# Patient Record
Sex: Female | Born: 1970 | Race: Black or African American | Hispanic: No | Marital: Single | Smoking: Current some day smoker
Health system: Southern US, Community
[De-identification: ages and names within clinical notes are randomized; demographics above are authoritative.]

## PROBLEM LIST (undated history)

## (undated) DIAGNOSIS — R51 Headache: Secondary | ICD-10-CM

## (undated) DIAGNOSIS — I499 Cardiac arrhythmia, unspecified: Secondary | ICD-10-CM

## (undated) DIAGNOSIS — R200 Anesthesia of skin: Secondary | ICD-10-CM

## (undated) DIAGNOSIS — F32A Depression, unspecified: Secondary | ICD-10-CM

## (undated) DIAGNOSIS — F329 Major depressive disorder, single episode, unspecified: Secondary | ICD-10-CM

## (undated) DIAGNOSIS — IMO0001 Reserved for inherently not codable concepts without codable children: Secondary | ICD-10-CM

## (undated) DIAGNOSIS — I209 Angina pectoris, unspecified: Secondary | ICD-10-CM

## (undated) DIAGNOSIS — R519 Headache, unspecified: Secondary | ICD-10-CM

## (undated) DIAGNOSIS — E119 Type 2 diabetes mellitus without complications: Secondary | ICD-10-CM

## (undated) HISTORY — PX: BACK SURGERY: SHX140

## (undated) HISTORY — PX: APPENDECTOMY: SHX54

## (undated) HISTORY — PX: ABLATION: SHX5711

## (undated) HISTORY — PX: BREAST REDUCTION SURGERY: SHX8

## (undated) HISTORY — PX: WISDOM TOOTH EXTRACTION: SHX21

## (undated) HISTORY — PX: COLONOSCOPY: SHX174

## (undated) HISTORY — PX: OTHER SURGICAL HISTORY: SHX169

## (undated) SURGERY — Surgical Case
Anesthesia: *Unknown

---

## 1999-12-07 ENCOUNTER — Inpatient Hospital Stay (HOSPITAL_COMMUNITY)
Admission: RE | Admit: 1999-12-07 | Discharge: 1999-12-10 | Payer: Self-pay | Admitting: Physical Medicine and Rehabilitation

## 2002-06-18 ENCOUNTER — Other Ambulatory Visit: Admission: RE | Admit: 2002-06-18 | Discharge: 2002-06-18 | Payer: Self-pay | Admitting: Obstetrics and Gynecology

## 2004-08-26 ENCOUNTER — Other Ambulatory Visit: Admission: RE | Admit: 2004-08-26 | Discharge: 2004-08-26 | Payer: Self-pay | Admitting: Obstetrics and Gynecology

## 2008-04-09 ENCOUNTER — Emergency Department (HOSPITAL_BASED_OUTPATIENT_CLINIC_OR_DEPARTMENT_OTHER): Admission: EM | Admit: 2008-04-09 | Discharge: 2008-04-09 | Payer: Self-pay | Admitting: Emergency Medicine

## 2008-09-16 ENCOUNTER — Emergency Department (HOSPITAL_BASED_OUTPATIENT_CLINIC_OR_DEPARTMENT_OTHER): Admission: EM | Admit: 2008-09-16 | Discharge: 2008-09-16 | Payer: Self-pay | Admitting: Emergency Medicine

## 2008-10-09 ENCOUNTER — Emergency Department (HOSPITAL_BASED_OUTPATIENT_CLINIC_OR_DEPARTMENT_OTHER): Admission: EM | Admit: 2008-10-09 | Discharge: 2008-10-09 | Payer: Self-pay | Admitting: Emergency Medicine

## 2009-09-13 ENCOUNTER — Emergency Department (HOSPITAL_BASED_OUTPATIENT_CLINIC_OR_DEPARTMENT_OTHER): Admission: EM | Admit: 2009-09-13 | Discharge: 2009-09-13 | Payer: Self-pay | Admitting: Emergency Medicine

## 2009-11-06 ENCOUNTER — Emergency Department (HOSPITAL_BASED_OUTPATIENT_CLINIC_OR_DEPARTMENT_OTHER): Admission: EM | Admit: 2009-11-06 | Discharge: 2009-11-06 | Payer: Self-pay | Admitting: Emergency Medicine

## 2010-07-30 NOTE — Discharge Summary (Signed)
[place]  Patient:    Gina Nichols, Gina Nichols                     MRN: [id number] Adm. Date:  [id number] Disc. Date: 12/10/99 Attending:  Evern Core Dictator:   Bynum Bellows. Idacavage, P.A.C.                           Discharge Summary  DIAGNOSES: 1. Upendynoma. 2. Status post craniotomy. 3. Candida. 4. Anemia.  HISTORY OF PRESENT ILLNESS:  The patient is a 40 year old female who presented to [facility] on November 24, 1999, with a two week history of headache.  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  She was placed on Decadron and Dilantin for seizure prophylaxis.  She underwent bifrontal craniotomy and transcortical resection of interventricular tumor on November 26, 1999, by Dr. ______. Pathology showed upendynoma.  The patient had anemia requiring transfusion. She was placed on Ancef for prophylaxis of her ventriculostomy site.  She developed a right paresis which seemed to be improving.  Was placed on Monostat for vaginal yeast infection.  Blood pressure initially controlled on Vasotec, now stable on no medications.  Able to ambulate with a quad cane.  PAST MEDICAL HISTORY: 1. Status post appendectomy. 2. Status post breast reduction.  SOCIAL HISTORY:  Occasional tobacco, alcohol, and marijuana usage.  Will be discharged home to mothers house.  HOSPITAL COURSE:  The patient was admitted to [facility] on December 07, 1999, where she participated in physical, occupational, and speech therapies.  She participated in a community outing where she went to [employer].  She was unable to estimate the cost of her meal in her head, but was able to add numbers with a pen and paper.  As far as mobility she is noted to be independent with bed mobility, transfers, and ambulation.  Able to ambulate greater than 1000 feet on uneven surfaces with no assistive  device. Challenged physically only by high level balance activities.  Able to stand, kick a ball, play balloon tennis, able to state month when given multiple choice, knew the year.  As far as ______ reliability for biographical information she was 90%, and for factual information she was 25%.  She was able to follow one and two step commands, and understand basic conversation. As far as high level comprehension she was unable to follow multi-step commands.  Comprehensive approximately 50%.  Her swallowing was noted to be regular with thin liquids.  At this point in time it was felt that she was able to be discharged to home with supervision due to impaired cognition.  DISCHARGE MEDICATIONS: 1. Dilantin 100 mg two b.i.d. 2. Monostat for two more days. 3. Multivitamin daily.  ACTIVITY:  She requires supervision.  DIET:  She was asked to follow a balanced diet.  WOUND CARE:  Keep her wound clean and dry.  FOLLOWUP:  Dr. ______ in two weeks.  On the day of discharge her Dilantin level was noted to be 8.6, and her H&H was 11.1 and 31.1.  CONDITION ON DISCHARGE:  Stable. DD:  12/10/99 TD:  12/10/99 Job: [id number]

## 2010-10-01 ENCOUNTER — Encounter: Payer: Self-pay | Admitting: *Deleted

## 2010-10-01 ENCOUNTER — Emergency Department (HOSPITAL_BASED_OUTPATIENT_CLINIC_OR_DEPARTMENT_OTHER)
Admission: EM | Admit: 2010-10-01 | Discharge: 2010-10-02 | Disposition: A | Discharge status: Home / Self Care | Payer: Medicare Other | Attending: Emergency Medicine | Admitting: Emergency Medicine

## 2010-10-01 DIAGNOSIS — L255 Unspecified contact dermatitis due to plants, except food: Secondary | ICD-10-CM | POA: Insufficient documentation

## 2010-10-01 DIAGNOSIS — T622X1A Toxic effect of other ingested (parts of) plant(s), accidental (unintentional), initial encounter: Secondary | ICD-10-CM | POA: Insufficient documentation

## 2010-10-01 DIAGNOSIS — L259 Unspecified contact dermatitis, unspecified cause: Secondary | ICD-10-CM

## 2010-10-01 MED ORDER — DIPHENHYDRAMINE HCL 50 MG/ML IJ SOLN
50.0000 mg | Freq: Once | INTRAMUSCULAR | Status: AC
  Administered 2010-10-02: 50 mg via INTRAMUSCULAR
  Filled 2010-10-01: qty 1

## 2010-10-01 MED ORDER — DEXAMETHASONE SODIUM PHOSPHATE 10 MG/ML IJ SOLN
10.0000 mg | Freq: Once | INTRAMUSCULAR | Status: AC
  Administered 2010-10-02: 10 mg via INTRAMUSCULAR
  Filled 2010-10-01: qty 1

## 2010-10-01 NOTE — ED Notes (Signed)
Pt presents to ED today with poison ivy exposure.  Pt states "its spreading"  Pt tried calamine lotion with no relief of sx.

## 2010-10-02 MED ORDER — DIPHENHYDRAMINE HCL 25 MG PO TABS: 25.0000 mg | ORAL_TABLET | Freq: Four times a day (QID) | ORAL | Status: AC

## 2010-10-02 MED ORDER — PREDNISONE 10 MG PO TABS: 20.0000 mg | ORAL_TABLET | Freq: Every day | ORAL | Status: AC

## 2010-10-02 NOTE — ED Provider Notes (Signed)
History     Chief Complaint  Patient presents with  . Poison Ivy   Patient is a 40 y.o. female presenting with Poison Ivy. The history is provided by the patient.  Poison Lajoyce Corners This is a new problem. The current episode started 2 days ago. The problem occurs constantly. The problem has been gradually worsening. Pertinent negatives include no shortness of breath. The symptoms are aggravated by nothing. She has tried nothing for the symptoms.  h/o a/w posion ivy exposure, no fever  History reviewed. No pertinent past medical history.  History reviewed. No pertinent past surgical history.  No family history on file.  History  Substance Use Topics  . Smoking status: Never Smoker   . Smokeless tobacco: Not on file  . Alcohol Use: No    OB History    Grav Para Term Preterm Abortions TAB SAB Ect Mult Living                  Review of Systems  Respiratory: Negative for shortness of breath.   All other systems reviewed and are negative.    Physical Exam  BP 148/88  Pulse 75  Temp(Src) 98 F (36.7 C) (Oral)  Resp 18  SpO2 99%  Physical Exam  Nursing note and vitals reviewed. Constitutional: She is oriented to person, place, and time. She appears well-developed and well-nourished.  Non-toxic appearance.  HENT:  Head: Normocephalic and atraumatic.  Eyes: Conjunctivae are normal. Pupils are equal, round, and reactive to light.  Neck: Normal range of motion.  Cardiovascular: Normal rate.   Pulmonary/Chest: Effort normal.  Neurological: She is alert and oriented to person, place, and time.  Skin: Skin is warm and dry.  Psychiatric: She has a normal mood and affect.  skin: vesicles with erythema noted to left forearm and back c/w dermatitis  ED Course  Procedures  MDM   Decadron and benadryl given here      Toy Baker, MD 10/02/10 [phone]

## 2012-08-18 DIAGNOSIS — D496 Neoplasm of unspecified behavior of brain: Secondary | ICD-10-CM | POA: Insufficient documentation

## 2013-05-04 ENCOUNTER — Emergency Department (HOSPITAL_BASED_OUTPATIENT_CLINIC_OR_DEPARTMENT_OTHER): Payer: Medicare Other

## 2013-05-04 ENCOUNTER — Emergency Department (HOSPITAL_BASED_OUTPATIENT_CLINIC_OR_DEPARTMENT_OTHER)
Admission: EM | Admit: 2013-05-04 | Discharge: 2013-05-05 | Disposition: A | Discharge status: Other Acute Inpatient Hospital | Payer: Medicare Other | Attending: Emergency Medicine | Admitting: Emergency Medicine

## 2013-05-04 ENCOUNTER — Encounter (HOSPITAL_BASED_OUTPATIENT_CLINIC_OR_DEPARTMENT_OTHER): Payer: Self-pay | Admitting: Emergency Medicine

## 2013-05-04 DIAGNOSIS — R5383 Other fatigue: Secondary | ICD-10-CM

## 2013-05-04 DIAGNOSIS — K5289 Other specified noninfective gastroenteritis and colitis: Secondary | ICD-10-CM | POA: Insufficient documentation

## 2013-05-04 DIAGNOSIS — Z79899 Other long term (current) drug therapy: Secondary | ICD-10-CM | POA: Insufficient documentation

## 2013-05-04 DIAGNOSIS — R5381 Other malaise: Secondary | ICD-10-CM | POA: Insufficient documentation

## 2013-05-04 DIAGNOSIS — K529 Noninfective gastroenteritis and colitis, unspecified: Secondary | ICD-10-CM

## 2013-05-04 DIAGNOSIS — I498 Other specified cardiac arrhythmias: Secondary | ICD-10-CM | POA: Insufficient documentation

## 2013-05-04 DIAGNOSIS — I214 Non-ST elevation (NSTEMI) myocardial infarction: Secondary | ICD-10-CM | POA: Insufficient documentation

## 2013-05-04 DIAGNOSIS — I471 Supraventricular tachycardia: Secondary | ICD-10-CM

## 2013-05-04 LAB — BASIC METABOLIC PANEL
BUN: 13 mg/dL (ref 6–23)
CHLORIDE: 101 meq/L (ref 96–112)
CO2: 22 mEq/L (ref 19–32)
Calcium: 8.6 mg/dL (ref 8.4–10.5)
Creatinine, Ser: 1 mg/dL (ref 0.50–1.10)
GFR calc non Af Amer: 68 mL/min — ABNORMAL LOW (ref >90)
GFR, EST AFRICAN AMERICAN: 79 mL/min — AB (ref >90)
Glucose, Bld: 113 mg/dL — ABNORMAL HIGH (ref 70–99)
POTASSIUM: 3.7 meq/L (ref 3.7–5.3)
Sodium: 139 mEq/L (ref 137–147)

## 2013-05-04 LAB — CBC WITH DIFFERENTIAL/PLATELET
BASOS PCT: 0 % (ref 0–1)
Basophils Absolute: 0 10*3/uL (ref 0.0–0.1)
Eosinophils Absolute: 0 10*3/uL (ref 0.0–0.7)
Eosinophils Relative: 0 % (ref 0–5)
HCT: 42.8 % (ref 36.0–46.0)
HEMOGLOBIN: 13.9 g/dL (ref 12.0–15.0)
LYMPHS PCT: 15 % (ref 12–46)
Lymphs Abs: 1 10*3/uL (ref 0.7–4.0)
MCH: 29.7 pg (ref 26.0–34.0)
MCHC: 32.5 g/dL (ref 30.0–36.0)
MCV: 91.5 fL (ref 78.0–100.0)
MONOS PCT: 13 % — AB (ref 3–12)
Monocytes Absolute: 0.9 10*3/uL (ref 0.1–1.0)
NEUTROS ABS: 4.6 10*3/uL (ref 1.7–7.7)
NEUTROS PCT: 71 % (ref 43–77)
Platelets: 298 10*3/uL (ref 150–400)
RBC: 4.68 MIL/uL (ref 3.87–5.11)
RDW: 13.4 % (ref 11.5–15.5)
WBC: 6.4 10*3/uL (ref 4.0–10.5)

## 2013-05-04 LAB — TROPONIN I: Troponin I: 0.37 ng/mL (ref <0.30)

## 2013-05-04 MED ORDER — SODIUM CHLORIDE 0.9 % IV BOLUS (SEPSIS)
1000.0000 mL | Freq: Once | INTRAVENOUS | Status: AC
  Administered 2013-05-04: 1000 mL via INTRAVENOUS

## 2013-05-04 MED ORDER — ADENOSINE 6 MG/2ML IV SOLN
INTRAVENOUS | Status: AC
  Administered 2013-05-04: 6 mg
  Filled 2013-05-04: qty 6

## 2013-05-04 MED ORDER — ASPIRIN 81 MG PO CHEW
324.0000 mg | CHEWABLE_TABLET | Freq: Once | ORAL | Status: AC
  Administered 2013-05-05: 324 mg via ORAL
  Filled 2013-05-04: qty 4

## 2013-05-04 NOTE — ED Provider Notes (Addendum)
CSN: [id number]     Arrival date & time 05/04/13  1845 History  This chart was scribed for Gina Johns, MD by Zettie Pho, ED Scribe. This patient was seen in room MHT14/MHT14 and the patient's care was started at 7:09 PM.    Chief Complaint  Patient presents with  . Emesis  . Diarrhea   The history is provided by the patient. No language interpreter was used.   HPI Comments: Gina Nichols is a 43 y.o. female who presents to the Emergency Department complaining of persistent nausea with associated multiple episodes of emesis and watery diarrhea onset about 2 days ago. She denies any emesis today and that she was able to tolerate some chicken noodle soup and applesauce earlier today, but had diarrhea soon afterwards. Patient is also complaining of diffuse weakness. Patient reports that she has been exposed to sick contacts with similar symptoms. She denies fever.  She reports some associated chest pain, palpitations, diaphoresis, and shortness of breath onset about 45 minutes ago, which she states has been gradually improving. This started suddenly whe she was getting in the car to come to the ED.  Patient presents to the ED with a heart rate of 209. Patient reports a history of unspecified cardiomegaly, but denies history of HTN or CAD. She states that she has been on blood pressure medication in the past, but has not taken it "in a while."  History reviewed. No pertinent past medical history. Past Surgical History  Procedure Laterality Date  . Appendectomy    . Brain tumor    . Back surgery     History reviewed. No pertinent family history. History  Substance Use Topics  . Smoking status: Never Smoker   . Smokeless tobacco: Not on file  . Alcohol Use: No   OB History   Grav Para Term Preterm Abortions TAB SAB Ect Mult Living                 Review of Systems  Constitutional: Negative for fever, chills, diaphoresis and fatigue.  HENT: Negative for congestion, rhinorrhea and  sneezing.   Eyes: Negative.   Respiratory: Positive for shortness of breath. Negative for cough and chest tightness.   Cardiovascular: Positive for chest pain and palpitations. Negative for leg swelling.  Gastrointestinal: Positive for nausea, vomiting and diarrhea. Negative for abdominal pain and blood in stool.  Genitourinary: Negative for frequency, hematuria, flank pain and difficulty urinating.  Musculoskeletal: Negative for arthralgias and back pain.  Skin: Negative for rash.  Neurological: Positive for weakness. Negative for dizziness, speech difficulty, numbness and headaches.   Allergies  Review of patient's allergies indicates no known allergies.  Home Medications   Current Outpatient Rx  Name  Route  Sig  Dispense  Refill  . benzonatate (TESSALON) 100 MG capsule   Oral   Take by mouth 3 (three) times daily as needed for cough.         . multivitamin (THERAGRAN) tablet   Oral   Take 1 tablet by mouth daily.           Marland Kitchen oxyCODONE (OXYCONTIN) 10 MG 12 hr tablet   Oral   Take 10 mg by mouth every 12 (twelve) hours as needed.           Marland Kitchen NAPROXEN PO   Oral   Take 1 tablet by mouth daily as needed.           Marland Kitchen oxyCODONE-acetaminophen (PERCOCET) 10-325 MG per tablet  Oral   Take 1 tablet by mouth daily as needed.            Triage Vitals: BP 81/64  Pulse 99  Temp(Src) 99.5 F (37.5 C)  Resp 24  Ht 5\' 5"  (1.651 m)  Wt 226 lb (102.513 kg)  BMI 37.61 kg/m2  SpO2 99%  Physical Exam  Constitutional: She is oriented to person, place, and time. She appears well-developed and well-nourished.  HENT:  Head: Normocephalic and atraumatic.  Eyes: Pupils are equal, round, and reactive to light.  Neck: Normal range of motion. Neck supple.  Cardiovascular: Regular rhythm and normal heart sounds.  Tachycardia present.   Tachycardic, but regular.   Pulmonary/Chest: Effort normal and breath sounds normal. No respiratory distress. She has no wheezes. She has no  rales. She exhibits no tenderness.  Abdominal: Soft. Bowel sounds are normal. There is no tenderness. There is no rebound and no guarding.  Musculoskeletal: Normal range of motion. She exhibits no edema.  Lymphadenopathy:    She has no cervical adenopathy.  Neurological: She is alert and oriented to person, place, and time.  Skin: Skin is warm and dry. No rash noted.  Psychiatric: She has a normal mood and affect.    ED Course  Procedures (including critical care time)  DIAGNOSTIC STUDIES: Oxygen Saturation is 99% on room air, normal by my interpretation.    COORDINATION OF CARE: 7:15 PM- Discussed that patient's heart rhythm is irregular, which may have been brought on by her current GI illness. Ordered adenosine to manage symptoms. Patient's heart rate was brought down to 109 with regular sinus rhythm after receiving the adenosine. Will order IV fluids. Will order blood labs. Discussed treatment plan with patient at bedside and patient verbalized agreement.   Results for orders placed during the hospital encounter of 05/04/13  CBC WITH DIFFERENTIAL      Result Value Ref Range   WBC 6.4  4.0 - 10.5 K/uL   RBC 4.68  3.87 - 5.11 MIL/uL   Hemoglobin 13.9  12.0 - 15.0 g/dL   HCT 42.8  36.0 - 46.0 %   MCV 91.5  78.0 - 100.0 fL   MCH 29.7  26.0 - 34.0 pg   MCHC 32.5  30.0 - 36.0 g/dL   RDW 13.4  11.5 - 15.5 %   Platelets 298  150 - 400 K/uL   Neutrophils Relative % 71  43 - 77 %   Neutro Abs 4.6  1.7 - 7.7 K/uL   Lymphocytes Relative 15  12 - 46 %   Lymphs Abs 1.0  0.7 - 4.0 K/uL   Monocytes Relative 13 (*) 3 - 12 %   Monocytes Absolute 0.9  0.1 - 1.0 K/uL   Eosinophils Relative 0  0 - 5 %   Eosinophils Absolute 0.0  0.0 - 0.7 K/uL   Basophils Relative 0  0 - 1 %   Basophils Absolute 0.0  0.0 - 0.1 K/uL  BASIC METABOLIC PANEL      Result Value Ref Range   Sodium 139  137 - 147 mEq/L   Potassium 3.7  3.7 - 5.3 mEq/L   Chloride 101  96 - 112 mEq/L   CO2 22  19 - 32 mEq/L    Glucose, Bld 113 (*) 70 - 99 mg/dL   BUN 13  6 - 23 mg/dL   Creatinine, Ser 1.00  0.50 - 1.10 mg/dL   Calcium 8.6  8.4 - 10.5 mg/dL   GFR calc  non Af Amer 68 (*) >90 mL/min   GFR calc Af Amer 79 (*) >90 mL/min  TROPONIN I      Result Value Ref Range   Troponin I <0.30  <0.30 ng/mL  TROPONIN I      Result Value Ref Range   Troponin I 0.37 (*) <0.30 ng/mL   Dg Chest Port 1 View  05/04/2013   CLINICAL DATA:  Chest pain.  Shortness of breath.  EXAM: PORTABLE CHEST - 1 VIEW  COMPARISON:  None.  FINDINGS: Borderline cardiomegaly. No CHF. Lungs are clear. No pneumothorax. No acute bony abnormality.  IMPRESSION: Borderline cardiomegaly, no CHF.  No acute pulmonary disease.   Electronically Signed   By: Marcello Moores  Register   On: 05/04/2013 23:44      Date: 05/04/2013  Rate: 209  Rhythm: supraventricular tachycardia (SVT)  QRS Axis: normal  Intervals: normal  ST/T Wave abnormalities: nonspecific ST/T changes  Conduction Disutrbances:none  Narrative Interpretation:   Old EKG Reviewed: none available   Date: 05/04/2013  Rate: 100  Rhythm: normal sinus rhythm  QRS Axis: normal  Intervals: normal  ST/T Wave abnormalities: normal  Conduction Disutrbances:none  Narrative Interpretation:   Old EKG Reviewed: unchanged    MDM   Final diagnoses:  NSTEMI (non-ST elevated myocardial infarction)  SVT (supraventricular tachycardia)  Gastroenteritis    CRITICAL CARE Performed by: Tamie Minteer Total critical care time: 70 Critical care time was exclusive of separately billable procedures and treating other patients. Critical care was necessary to treat or prevent imminent or life-threatening deterioration. Critical care was time spent personally by me on the following activities: development of treatment plan with patient and/or surrogate as well as nursing, discussions with consultants, evaluation of patient's response to treatment, examination of patient, obtaining history from patient or  surrogate, ordering and performing treatments and interventions, ordering and review of laboratory studies, ordering and review of radiographic studies, pulse oximetry and re-evaluation of patient's condition.   Patient presents with gastroenteritis type symptoms with ongoing diarrhea. She appears very dehydrated on arrival. She was in SVT on arrival. This converted with adenosine. Her chest pain resolved after the conversion with adenosine. She was still tachycardic following the adenosine but was in a sinus rhythm. Her tachycardia resolved after 2 L IV fluids. She's currently has a heart rate in the 80s to 90s. She had one brief episode of chest tightness during her ED stay. This lasted a few minutes. I checked a second troponin which was mildly elevated. Given this I feel that she's currently to be admitted for further cardiac evaluation. Patient is requesting to [facility] to [facility]. I spoke with Dr. Marijo File, the cardiologist on-call at that facility and he requests that the hospitalist admit the patient. I will go ahead and give the patient aspirin however Dr. Marijo File did not feel that we need to start heparin at this point.  I spoke with Dr. Juleen China who accepts the pt for transfer.  I personally performed the services described in this documentation, which was scribed in my presence.  The recorded information has been reviewed and considered.     Gina Johns, MD 05/04/13 [id number]  Gina Johns, MD 05/04/13 2348

## 2013-05-04 NOTE — ED Notes (Signed)
Patient started haexperiencing  n/v/d two days ago, able to eat chicken soup today, but had diarrhea soon afterwards., no fever that she is aware, vomited last night and some diarrhea today after eating soup, no meds taken

## 2013-05-05 MED ORDER — ONDANSETRON HCL 4 MG/2ML IJ SOLN
INTRAMUSCULAR | Status: AC
  Administered 2013-05-05: 4 mg
  Filled 2013-05-05: qty 2

## 2013-08-25 ENCOUNTER — Encounter (HOSPITAL_BASED_OUTPATIENT_CLINIC_OR_DEPARTMENT_OTHER): Payer: Self-pay | Admitting: Emergency Medicine

## 2013-08-25 DIAGNOSIS — Z79899 Other long term (current) drug therapy: Secondary | ICD-10-CM | POA: Insufficient documentation

## 2013-08-25 DIAGNOSIS — M7989 Other specified soft tissue disorders: Secondary | ICD-10-CM | POA: Insufficient documentation

## 2013-08-25 DIAGNOSIS — Z791 Long term (current) use of non-steroidal anti-inflammatories (NSAID): Secondary | ICD-10-CM | POA: Insufficient documentation

## 2013-08-25 DIAGNOSIS — M5380 Other specified dorsopathies, site unspecified: Secondary | ICD-10-CM | POA: Insufficient documentation

## 2013-08-25 DIAGNOSIS — Z3202 Encounter for pregnancy test, result negative: Secondary | ICD-10-CM | POA: Insufficient documentation

## 2013-08-25 DIAGNOSIS — E119 Type 2 diabetes mellitus without complications: Secondary | ICD-10-CM | POA: Insufficient documentation

## 2013-08-25 NOTE — ED Notes (Signed)
Pt reports back pain spasm's and leg edema since Friday past denies event or injury

## 2013-08-26 ENCOUNTER — Emergency Department (HOSPITAL_BASED_OUTPATIENT_CLINIC_OR_DEPARTMENT_OTHER)
Admission: EM | Admit: 2013-08-26 | Discharge: 2013-08-26 | Disposition: A | Discharge status: Home / Self Care | Payer: Medicare Other | Attending: Emergency Medicine | Admitting: Emergency Medicine

## 2013-08-26 ENCOUNTER — Encounter (HOSPITAL_BASED_OUTPATIENT_CLINIC_OR_DEPARTMENT_OTHER): Payer: Self-pay | Admitting: Emergency Medicine

## 2013-08-26 ENCOUNTER — Emergency Department (HOSPITAL_BASED_OUTPATIENT_CLINIC_OR_DEPARTMENT_OTHER): Payer: Medicare Other

## 2013-08-26 DIAGNOSIS — M7989 Other specified soft tissue disorders: Secondary | ICD-10-CM

## 2013-08-26 DIAGNOSIS — M62830 Muscle spasm of back: Secondary | ICD-10-CM

## 2013-08-26 HISTORY — DX: Type 2 diabetes mellitus without complications: E11.9

## 2013-08-26 LAB — URINALYSIS, ROUTINE W REFLEX MICROSCOPIC
Bilirubin Urine: NEGATIVE
Glucose, UA: NEGATIVE mg/dL
Hgb urine dipstick: NEGATIVE
KETONES UR: NEGATIVE mg/dL
LEUKOCYTES UA: NEGATIVE
NITRITE: NEGATIVE
PH: 6 (ref 5.0–8.0)
PROTEIN: NEGATIVE mg/dL
Specific Gravity, Urine: 1.029 (ref 1.005–1.030)
Urobilinogen, UA: 1 mg/dL (ref 0.0–1.0)

## 2013-08-26 LAB — BASIC METABOLIC PANEL
BUN: 12 mg/dL (ref 6–23)
CHLORIDE: 106 meq/L (ref 96–112)
CO2: 26 mEq/L (ref 19–32)
Calcium: 8.9 mg/dL (ref 8.4–10.5)
Creatinine, Ser: 0.8 mg/dL (ref 0.50–1.10)
GFR calc non Af Amer: 88 mL/min — ABNORMAL LOW (ref >90)
Glucose, Bld: 105 mg/dL — ABNORMAL HIGH (ref 70–99)
POTASSIUM: 3.9 meq/L (ref 3.7–5.3)
Sodium: 143 mEq/L (ref 137–147)

## 2013-08-26 LAB — CBC WITH DIFFERENTIAL/PLATELET
BASOS ABS: 0 10*3/uL (ref 0.0–0.1)
BASOS PCT: 0 % (ref 0–1)
Eosinophils Absolute: 0.1 10*3/uL (ref 0.0–0.7)
Eosinophils Relative: 1 % (ref 0–5)
HCT: 33.2 % — ABNORMAL LOW (ref 36.0–46.0)
Hemoglobin: 11 g/dL — ABNORMAL LOW (ref 12.0–15.0)
Lymphocytes Relative: 29 % (ref 12–46)
Lymphs Abs: 2.3 10*3/uL (ref 0.7–4.0)
MCH: 31.5 pg (ref 26.0–34.0)
MCHC: 33.1 g/dL (ref 30.0–36.0)
MCV: 95.1 fL (ref 78.0–100.0)
Monocytes Absolute: 0.7 10*3/uL (ref 0.1–1.0)
Monocytes Relative: 9 % (ref 3–12)
NEUTROS ABS: 4.7 10*3/uL (ref 1.7–7.7)
NEUTROS PCT: 61 % (ref 43–77)
PLATELETS: 252 10*3/uL (ref 150–400)
RBC: 3.49 MIL/uL — ABNORMAL LOW (ref 3.87–5.11)
RDW: 12.8 % (ref 11.5–15.5)
WBC: 7.7 10*3/uL (ref 4.0–10.5)

## 2013-08-26 LAB — D-DIMER, QUANTITATIVE (NOT AT ARMC)

## 2013-08-26 LAB — PREGNANCY, URINE: PREG TEST UR: NEGATIVE

## 2013-08-26 MED ORDER — METHOCARBAMOL 500 MG PO TABS
1000.0000 mg | ORAL_TABLET | Freq: Once | ORAL | Status: AC
  Administered 2013-08-26: 1000 mg via ORAL
  Filled 2013-08-26: qty 2

## 2013-08-26 MED ORDER — METHOCARBAMOL 500 MG PO TABS: 500.0000 mg | ORAL_TABLET | Freq: Two times a day (BID) | ORAL | Status: DC

## 2013-08-26 MED ORDER — KETOROLAC TROMETHAMINE 60 MG/2ML IM SOLN
60.0000 mg | Freq: Once | INTRAMUSCULAR | Status: AC
  Administered 2013-08-26: 60 mg via INTRAMUSCULAR
  Filled 2013-08-26: qty 2

## 2013-08-26 MED ORDER — NAPROXEN 375 MG PO TABS: 375.0000 mg | ORAL_TABLET | Freq: Two times a day (BID) | ORAL | Status: DC

## 2013-08-26 MED ORDER — TRAMADOL HCL 50 MG PO TABS
50.0000 mg | ORAL_TABLET | Freq: Once | ORAL | Status: AC
  Administered 2013-08-26: 50 mg via ORAL
  Filled 2013-08-26: qty 1

## 2013-08-26 MED ORDER — TRAMADOL HCL 50 MG PO TABS: 50.0000 mg | ORAL_TABLET | Freq: Four times a day (QID) | ORAL | Status: DC | PRN

## 2013-08-26 NOTE — ED Provider Notes (Signed)
CSN: [id number]     Arrival date & time 08/25/13  2252 History  This chart was scribed for Gina Tomey Alfonso Patten, MD by Roxan Diesel, ED scribe.  This patient was seen in room MH12/MH12 and the patient's care was started at 12:37 AM. .    Chief Complaint  Patient presents with  . Back Pain  . Leg Swelling    Patient is a 43 y.o. female presenting with back pain. The history is provided by the patient. No language interpreter was used.  Back Pain Location:  Lumbar spine Quality: spasming. Radiates to:  R posterior upper leg Pain severity:  Moderate Context comment:  Back surgery in 2012  HPI Comments: Lonzo Cloud is a 43 y.o. female who presents to the Emergency Department complaining of leg swelling beginning yesterday. Patient states that the swelling is more severe in her left leg. She has elevated and ice her leg without relief of the swelling. She denies any recent long car or airplane trips. She reports having a cardiac ablastion two months ago and that she had similar leg swelling the day after the procedure, but it resolved the next day.   Patient also reports back spasms.  She states that the pain radiates to her upper right buttock. She has a history of back surgery in 2012.   Dr. Vista Lawman is her PCP.    Past Medical History  Diagnosis Date  . Diabetes mellitus without complication    Past Surgical History  Procedure Laterality Date  . Appendectomy    . Brain tumor    . Back surgery    . Ablation    . Breast reduction surgery     History reviewed. No pertinent family history. History  Substance Use Topics  . Smoking status: Never Smoker   . Smokeless tobacco: Never Used  . Alcohol Use: No   OB History   Grav Para Term Preterm Abortions TAB SAB Ect Mult Living                   Review of Systems  Cardiovascular: Positive for leg swelling.  Musculoskeletal: Positive for back pain.  All other systems reviewed and are  negative.     Allergies  Review of patient's allergies indicates no known allergies.  Home Medications   Prior to Admission medications   Medication Sig Start Date End Date Taking? Authorizing Provider  benzonatate (TESSALON) 100 MG capsule Take by mouth 3 (three) times daily as needed for cough.    Historical Provider, MD  multivitamin [facility]) tablet Take 1 tablet by mouth daily.      Historical Provider, MD  NAPROXEN PO Take 1 tablet by mouth daily as needed.      Historical Provider, MD  oxyCODONE (OXYCONTIN) 10 MG 12 hr tablet Take 10 mg by mouth every 12 (twelve) hours as needed.      Historical Provider, MD  oxyCODONE-acetaminophen (PERCOCET) 10-325 MG per tablet Take 1 tablet by mouth daily as needed.      Historical Provider, MD   BP 141/89  Pulse 72  Temp(Src) 98.1 F (36.7 C) (Oral)  Resp 18  SpO2 99%  LMP 08/04/2013 Physical Exam  Nursing note and vitals reviewed. Constitutional: She is oriented to person, place, and time. She appears well-developed and well-nourished. No distress.  HENT:  Head: Normocephalic and atraumatic.  Mouth/Throat: Oropharynx is clear and moist and mucous membranes are normal.  Eyes: Conjunctivae and EOM are normal. Pupils are equal, round, and reactive  to light.  Neck: Normal range of motion. Neck supple. No tracheal deviation present.  Cardiovascular: Normal rate, regular rhythm and intact distal pulses.   Pulmonary/Chest: Effort normal and breath sounds normal. No respiratory distress. She has no wheezes. She has no rales.  Abdominal: Soft. Bowel sounds are normal. There is no tenderness. There is no rebound and no guarding.  Musculoskeletal: Normal range of motion. She exhibits no tenderness.  No cords in legs, equal warmth, all compartments are soft, no deformity. Negative anterior and posterior drawer test of B knees.  No laxity to varus or valgus stress no patella [place] Both feet neurovascularly intact. Capillary refill less  than 2 seconds.  Legs are both warm and equal in temperature to the touch, dorsalis pedis 2+ B and B achilles tendons intact.      Neurological: She is alert and oriented to person, place, and time. She has normal reflexes.  L5/s1 intact intact sensation 5/5 BLE  Minimal swelling of the LLE to mid calf  Skin: Skin is warm and dry.  Psychiatric: She has a normal mood and affect. Her behavior is normal.      ED Course  Procedures (including critical care time)   DIAGNOSTIC STUDIES: Oxygen Saturation is 00% on room air, normal by my interpretation.    COORDINATION OF CARE: 12:43 AM-Discussed treatment plan which includes x-ray of back, pain management, and labs with pt at bedside and pt agreed to plan.     Labs Review Labs Reviewed  PREGNANCY, URINE  URINALYSIS, ROUTINE W REFLEX MICROSCOPIC    Imaging Review No results found.   EKG Interpretation None      MDM   Final diagnoses:  None    Back spasm.  Will treat with pain medication muscle relaxants and NSAIDs.  No infection in urine normal XR. Sensation and motor intact.  Minimal swelling of the LLE in low risk patient without cords and benign exam.  No OCP nor hormonal therapy, not bed bound, no surgeries,  No travel.  Dddimer is negative excluding DVT.  Follow up with your family doctor tomorrow for recheck and further testing.    I personally performed the services described in this documentation, which was scribed in my presence. The recorded information has been reviewed and is accurate.     Carlisle Beers, MD 08/26/13 [phone]

## 2013-08-27 MED ORDER — TIZANIDINE HCL 4 MG PO TABS: 4.0000 mg | ORAL_TABLET | Freq: Four times a day (QID) | ORAL | Status: DC | PRN

## 2013-08-27 NOTE — Progress Notes (Signed)
  CARE MANAGEMENT ED NOTE 08/27/2013  Patient:  AHMARI, GARTON   Account Number:  [id number]  Date Initiated:  08/27/2013  Documentation initiated by:  Edwyna Shell  Subjective/Objective Assessment:   43 yo female was treated in the [facility] for back spasms and needed med assistance     Subjective/Objective Assessment Detail:     Action/Plan:   New presctiption faxed to pharmacy.   Action/Plan Detail:   Anticipated DC Date:       Status Recommendation to Physician:   Result of Recommendation:        Choice offered to / List presented to:            Status of service:  Completed, signed off  ED Comments:   ED Comments Detail:  CM received a phone call from Sampson Goon pharmacist, stating that the prescribed Robaxin from the [facility] visit on 08/26/13 was not covered by the patients insurance and is requesting a change in medication for coverage and affordability. This CM spoke with [facility] PA and she entered a new prescription for a preferred medication based on the patient [facility] work-up. This CM then faxed the new prescription to [facility] and called and spoke with Sharrie Rothman, the pharmacist, and informed her that a new script for a covered med has been faxed over. No further CM needs at this time.

## 2014-01-15 ENCOUNTER — Other Ambulatory Visit: Payer: Self-pay

## 2014-01-15 ENCOUNTER — Encounter (HOSPITAL_COMMUNITY): Payer: Self-pay

## 2014-01-15 ENCOUNTER — Encounter (HOSPITAL_COMMUNITY)
Admission: RE | Admit: 2014-01-15 | Discharge: 2014-01-15 | Disposition: A | Discharge status: Home / Self Care | Payer: Medicare Other | Source: Ambulatory Visit | Attending: Obstetrics and Gynecology | Admitting: Obstetrics and Gynecology

## 2014-01-15 HISTORY — DX: Angina pectoris, unspecified: I20.9

## 2014-01-15 HISTORY — DX: Cardiac arrhythmia, unspecified: I49.9

## 2014-01-15 HISTORY — DX: Anesthesia of skin: R20.0

## 2014-01-15 HISTORY — DX: Headache, unspecified: R51.9

## 2014-01-15 HISTORY — DX: Reserved for inherently not codable concepts without codable children: IMO0001

## 2014-01-15 HISTORY — DX: Depression, unspecified: F32.A

## 2014-01-15 HISTORY — DX: Major depressive disorder, single episode, unspecified: F32.9

## 2014-01-15 HISTORY — DX: Headache: R51

## 2014-01-15 LAB — CBC
HEMATOCRIT: 36.4 % (ref 36.0–46.0)
Hemoglobin: 12.2 g/dL (ref 12.0–15.0)
MCH: 31.1 pg (ref 26.0–34.0)
MCHC: 33.5 g/dL (ref 30.0–36.0)
MCV: 92.9 fL (ref 78.0–100.0)
Platelets: 285 10*3/uL (ref 150–400)
RBC: 3.92 MIL/uL (ref 3.87–5.11)
RDW: 13.2 % (ref 11.5–15.5)
WBC: 6.1 10*3/uL (ref 4.0–10.5)

## 2014-01-15 LAB — COMPREHENSIVE METABOLIC PANEL
ALBUMIN: 3.4 g/dL — AB (ref 3.5–5.2)
ALK PHOS: 84 U/L (ref 39–117)
ALT: 14 U/L (ref 0–35)
ANION GAP: 11 (ref 5–15)
AST: 13 U/L (ref 0–37)
BUN: 10 mg/dL (ref 6–23)
CHLORIDE: 104 meq/L (ref 96–112)
CO2: 26 meq/L (ref 19–32)
Calcium: 9 mg/dL (ref 8.4–10.5)
Creatinine, Ser: 0.81 mg/dL (ref 0.50–1.10)
GFR calc Af Amer: >90 mL/min (ref >90)
GFR, EST NON AFRICAN AMERICAN: 87 mL/min — AB (ref >90)
Glucose, Bld: 96 mg/dL (ref 70–99)
Potassium: 3.9 mEq/L (ref 3.7–5.3)
Sodium: 141 mEq/L (ref 137–147)
Total Bilirubin: 0.2 mg/dL — ABNORMAL LOW (ref 0.3–1.2)
Total Protein: 6.4 g/dL (ref 6.0–8.3)

## 2014-01-15 NOTE — Patient Instructions (Addendum)
Your procedure is scheduled on:  Monday, NOV. 9, 2015  Enter through the Main Entrance of [facility] at: 6:00 A.M.  Pick up the phone at the desk and dial [phone].  Call this number if you have problems the morning of surgery: [phone].  Remember: Do NOT eat food:  AFTER MIDNIGHT SUNDAY Do NOT drink clear liquids after:  AFTER MIDNIGHT SUNDAY Take these medicines the morning of surgery with a SIP OF WATER: CARVEDILOL *STOP TAKING ALL VITAMINS/HERBAL SUPPLEMENTS, ASPIRIN, IBUPROFEN  Do NOT wear jewelry (body piercing), metal hair clips/bobby pins, make-up, or nail polish. Do NOT wear lotions, powders, or perfumes.  You may wear deoderant. Do NOT shave for 48 hours prior to surgery. Do NOT bring valuables to the hospital. Contacts, dentures, or bridgework may not be worn into surgery. Have a responsible adult drive you home and stay with you for 24 hours after your procedure

## 2014-01-17 ENCOUNTER — Encounter (HOSPITAL_COMMUNITY): Payer: Self-pay | Admitting: Anesthesiology

## 2014-01-17 NOTE — Pre-Procedure Instructions (Signed)
Left message at Dr. Jeneen Rinks McGukin's office that Dr. Assunta Gambles has requested Gina Nichols has a sleep study performed.

## 2014-01-17 NOTE — Anesthesia Preprocedure Evaluation (Deleted)
Anesthesia Evaluation    Airway        Dental   Pulmonary shortness of breath and with exertion, sleep apnea (needs sleep study (per 3/15 cardiology notes) - awakens from sleep with SOB) ,          Cardiovascular hypertension, On Home Beta Blockers + angina with exertion + PND and + DOE + dysrhythmias (s/p ablation for AVNR) Supra Ventricular Tachycardia  Hypercholesterolemia EKG - SR, non-specific t-wave changes  Seen by cardiology 01/16/14 after c/o ongoing CP & SOB at PAT visit.  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, memory deficits  Neuromuscular disease (left leg numbness since back surgery)    GI/Hepatic   Endo/Other  diabetes (pre-DM)  Renal/GU      Musculoskeletal   Abdominal   Peds  Hematology   Anesthesia Other Findings   Reproductive/Obstetrics                            Anesthesia Physical Anesthesia Plan  ASA: III  Anesthesia Plan: General ETT   Post-op Pain Management:    Induction:   Airway Management Planned:   Additional Equipment:   Intra-op Plan:   Post-operative Plan:   Informed Consent:   Plan Discussed with: Surgeon  Anesthesia Plan Comments: (Seen by cardiology 01/16/14 after c/o ongoing CP (with exertion, and with bending over) & SOB (with exertion, and wakes up from sleep feeling SOB) at PAT visit.  Cards ordered stress echo that would not be performed prior to planned surgery on 01/20/14 - surgery postponed until stress echo can be performed. Discussed with Dr Willis Modena who agrees.  Also, pt needs a sleep study - some of her symptoms are consistent with OSA.  Cardiologist mentioned this in their note in 3/15.)       Anesthesia Quick Evaluation

## 2014-01-20 ENCOUNTER — Encounter (HOSPITAL_COMMUNITY): Admission: RE | Payer: Self-pay | Source: Ambulatory Visit

## 2014-01-20 ENCOUNTER — Ambulatory Visit (HOSPITAL_COMMUNITY)
Admission: RE | Admit: 2014-01-20 | Payer: Medicare Other | Source: Ambulatory Visit | Admitting: Obstetrics and Gynecology

## 2014-01-20 SURGERY — LAPAROSCOPY OPERATIVE
Anesthesia: General | Laterality: Right

## 2014-02-25 ENCOUNTER — Encounter (HOSPITAL_COMMUNITY)
Admission: RE | Admit: 2014-02-25 | Discharge: 2014-02-25 | Disposition: A | Discharge status: Home / Self Care | Payer: Medicare Other | Source: Ambulatory Visit | Attending: Obstetrics and Gynecology | Admitting: Obstetrics and Gynecology

## 2014-02-25 ENCOUNTER — Encounter (HOSPITAL_COMMUNITY): Payer: Self-pay

## 2014-02-25 DIAGNOSIS — Z01812 Encounter for preprocedural laboratory examination: Secondary | ICD-10-CM | POA: Diagnosis present

## 2014-02-25 LAB — BASIC METABOLIC PANEL
ANION GAP: 10 (ref 5–15)
BUN: 10 mg/dL (ref 6–23)
CHLORIDE: 105 meq/L (ref 96–112)
CO2: 23 mEq/L (ref 19–32)
Calcium: 8.8 mg/dL (ref 8.4–10.5)
Creatinine, Ser: 0.73 mg/dL (ref 0.50–1.10)
Glucose, Bld: 82 mg/dL (ref 70–99)
POTASSIUM: 4 meq/L (ref 3.7–5.3)
Sodium: 138 mEq/L (ref 137–147)

## 2014-02-25 LAB — CBC
HEMATOCRIT: 37.8 % (ref 36.0–46.0)
Hemoglobin: 12.3 g/dL (ref 12.0–15.0)
MCH: 30.4 pg (ref 26.0–34.0)
MCHC: 32.5 g/dL (ref 30.0–36.0)
MCV: 93.6 fL (ref 78.0–100.0)
PLATELETS: 251 10*3/uL (ref 150–400)
RBC: 4.04 MIL/uL (ref 3.87–5.11)
RDW: 13.6 % (ref 11.5–15.5)
WBC: 7 10*3/uL (ref 4.0–10.5)

## 2014-02-25 NOTE — Patient Instructions (Addendum)
   Your procedure is scheduled on: DEC 21 AT 10AM  Enter through the Main Entrance of [facility] at:830AM Pick up the phone at the desk and dial [phone] and inform us of your arrival.  Please call this number if you have any problems the morning of surgery: [phone]  Remember: Do not eat food after midnight: DEC 20 Do not drink clear liquids after: DEC 20 Take these medicines the morning of surgery with a SIP OF WATER: TAKE COREQ DAY OF SURGERY  Do not wear jewelry, make-up, or FINGER nail polish No metal in your hair or on your body. Do not wear lotions, powders, perfumes.  You may wear deodorant.  Do not bring valuables to the hospital. Contacts, dentures or bridgework may not be worn into surgery.  Leave suitcase in the car. After Surgery it may be brought to your room. For patients being admitted to the hospital, checkout time is 11:00am the day of discharge.    Patients discharged on the day of surgery will not be allowed to drive home.

## 2014-03-02 NOTE — H&P (Signed)
Gina Nichols is an 43 y.o. female. She was seen for an annual exam in September, followed for a 6 cm simple right ovarian cyst.  Repeat ultrasound in September revealed that the cyst is about the same size, but now has a 3 cm solid area.  Ca-125 was normal at 7.  Surgical removal of the right ovary was recommended due to the change.  She was scheduled for surgery in November, but c/o chest pressure at her pre-op visit.  Surgery was cancelled to complete a cardiac work-up.  She had a normal stress ECHO and has cardiac clearance for surgery.  Pertinent Gynecological History: Last mammogram: normal Date: 11/2013 Last pap: normal Date: 2012 OB History: G1, P0010   Menstrual History: No LMP recorded.    Past Medical History  Diagnosis Date  . Shortness of breath dyspnea     with stairs, wakes up out of sleep, occassional wheezes noted by patient  . Diabetes mellitus without complication     pre-diabetic  . Depression     history of  . Headache     s/p brain tumor 11/1999  . Left leg numbness     s/p back surgery  . Anginal pain     Chest pressure when bending over most recent Oct. 31, 2015  . Dysrhythmia     SVT s/p ablation 2015    Past Surgical History  Procedure Laterality Date  . Appendectomy    . Brain tumor    . Back surgery    . Ablation    . Breast reduction surgery    . Colonoscopy    . Wisdom tooth extraction      No family history on file.  Social History:  reports that she has never smoked. She has never used smokeless tobacco. She reports that she does not drink alcohol or use illicit drugs.  Allergies: No Known Allergies  No prescriptions prior to admission    Review of Systems  Respiratory: Positive for shortness of breath (occasional).   Cardiovascular: Positive for chest pain (recent chest pressure).  Gastrointestinal: Negative.   Genitourinary: Negative.     There were no vitals taken for this visit. Physical Exam  Constitutional: She appears  well-developed and well-nourished.  Neck: Neck supple. No thyromegaly present.  Cardiovascular: Normal rate, regular rhythm and normal heart sounds.   No murmur heard. Respiratory: Effort normal and breath sounds normal. No respiratory distress. She has no wheezes.  GI: Soft. She exhibits no distension and no mass. There is no tenderness.  Genitourinary: Vagina normal.  Uterus normal size Palpable right ovarian cyst    No results found for this or any previous visit (from the past 24 hour(s)).  No results found.  Assessment/Plan: 5-6 cm right ovarian cyst, previously simple, now complex with a 3 cm solid area, normal Ca-125, being admitted for surgical removal of this ovary.  Surgical procedure, risks, alternatives have all been discussed.  Will admit for open laparoscopy with RSO and pelvic washings.  Gina Nichols D 03/02/2014, 7:57 PM

## 2014-03-03 ENCOUNTER — Encounter (HOSPITAL_COMMUNITY): Payer: Self-pay

## 2014-03-03 ENCOUNTER — Ambulatory Visit (HOSPITAL_COMMUNITY): Payer: Medicare Other | Admitting: Registered Nurse

## 2014-03-03 ENCOUNTER — Encounter (HOSPITAL_COMMUNITY)
Admission: RE | Disposition: A | Discharge status: Home / Self Care | Payer: Self-pay | Source: Ambulatory Visit | Attending: Obstetrics and Gynecology

## 2014-03-03 ENCOUNTER — Ambulatory Visit (HOSPITAL_COMMUNITY)
Admission: RE | Admit: 2014-03-03 | Discharge: 2014-03-03 | Disposition: A | Discharge status: Home / Self Care | Payer: Medicare Other | Source: Ambulatory Visit | Attending: Obstetrics and Gynecology | Admitting: Obstetrics and Gynecology

## 2014-03-03 DIAGNOSIS — N8320 Unspecified ovarian cysts: Secondary | ICD-10-CM | POA: Insufficient documentation

## 2014-03-03 DIAGNOSIS — F329 Major depressive disorder, single episode, unspecified: Secondary | ICD-10-CM | POA: Insufficient documentation

## 2014-03-03 DIAGNOSIS — Z6837 Body mass index (BMI) 37.0-37.9, adult: Secondary | ICD-10-CM | POA: Insufficient documentation

## 2014-03-03 DIAGNOSIS — E119 Type 2 diabetes mellitus without complications: Secondary | ICD-10-CM | POA: Insufficient documentation

## 2014-03-03 DIAGNOSIS — N83201 Unspecified ovarian cyst, right side: Secondary | ICD-10-CM | POA: Diagnosis present

## 2014-03-03 DIAGNOSIS — Z7982 Long term (current) use of aspirin: Secondary | ICD-10-CM | POA: Diagnosis not present

## 2014-03-03 HISTORY — PX: SALPINGOOPHORECTOMY: SHX82

## 2014-03-03 HISTORY — PX: LAPAROSCOPY: SHX197

## 2014-03-03 LAB — PREGNANCY, URINE: PREG TEST UR: NEGATIVE

## 2014-03-03 SURGERY — LAPAROSCOPY OPERATIVE
Anesthesia: General | Site: Abdomen | Laterality: Right

## 2014-03-03 MED ORDER — LACTATED RINGERS IV SOLN
INTRAVENOUS | Status: DC
  Administered 2014-03-03: 09:00:00 via INTRAVENOUS

## 2014-03-03 MED ORDER — BUPIVACAINE HCL (PF) 0.25 % IJ SOLN
INTRAMUSCULAR | Status: AC
  Filled 2014-03-03: qty 30

## 2014-03-03 MED ORDER — HEPARIN SODIUM (PORCINE) 5000 UNIT/ML IJ SOLN
INTRAMUSCULAR | Status: DC | PRN
  Administered 2014-03-03: 5000 [IU] via SUBCUTANEOUS

## 2014-03-03 MED ORDER — ONDANSETRON HCL 4 MG/2ML IJ SOLN
INTRAMUSCULAR | Status: DC | PRN
  Administered 2014-03-03: 4 mg via INTRAVENOUS

## 2014-03-03 MED ORDER — OXYCODONE HCL 5 MG PO TABS: 5.0000 mg | ORAL_TABLET | Freq: Once | ORAL | Status: DC | PRN

## 2014-03-03 MED ORDER — NEOSTIGMINE METHYLSULFATE 10 MG/10ML IV SOLN
INTRAVENOUS | Status: AC
  Filled 2014-03-03: qty 1

## 2014-03-03 MED ORDER — MEPERIDINE HCL 25 MG/ML IJ SOLN: 6.2500 mg | INTRAMUSCULAR | Status: DC | PRN

## 2014-03-03 MED ORDER — OXYCODONE-ACETAMINOPHEN 5-325 MG PO TABS: 1.0000 | ORAL_TABLET | ORAL | Status: DC | PRN

## 2014-03-03 MED ORDER — MIDAZOLAM HCL 5 MG/5ML IJ SOLN
INTRAMUSCULAR | Status: DC | PRN
  Administered 2014-03-03: 2 mg via INTRAVENOUS

## 2014-03-03 MED ORDER — ROCURONIUM BROMIDE 100 MG/10ML IV SOLN
INTRAVENOUS | Status: DC | PRN
  Administered 2014-03-03: 10 mg via INTRAVENOUS
  Administered 2014-03-03: 40 mg via INTRAVENOUS

## 2014-03-03 MED ORDER — LIDOCAINE HCL (CARDIAC) 20 MG/ML IV SOLN
INTRAVENOUS | Status: DC | PRN
  Administered 2014-03-03: 100 mg via INTRAVENOUS

## 2014-03-03 MED ORDER — DEXAMETHASONE SODIUM PHOSPHATE 4 MG/ML IJ SOLN
INTRAMUSCULAR | Status: DC | PRN
  Administered 2014-03-03: 4 mg via INTRAVENOUS

## 2014-03-03 MED ORDER — HEPARIN SODIUM (PORCINE) 5000 UNIT/ML IJ SOLN
INTRAMUSCULAR | Status: AC
  Filled 2014-03-03: qty 1

## 2014-03-03 MED ORDER — FENTANYL CITRATE 0.05 MG/ML IJ SOLN
INTRAMUSCULAR | Status: AC
  Filled 2014-03-03: qty 5

## 2014-03-03 MED ORDER — GLYCOPYRROLATE 0.2 MG/ML IJ SOLN
INTRAMUSCULAR | Status: DC | PRN
  Administered 2014-03-03: 0.6 mg via INTRAVENOUS

## 2014-03-03 MED ORDER — OXYCODONE HCL 5 MG/5ML PO SOLN: 5.0000 mg | Freq: Once | ORAL | Status: DC | PRN

## 2014-03-03 MED ORDER — ONDANSETRON HCL 4 MG/2ML IJ SOLN
INTRAMUSCULAR | Status: AC
  Filled 2014-03-03: qty 2

## 2014-03-03 MED ORDER — GLYCOPYRROLATE 0.2 MG/ML IJ SOLN
INTRAMUSCULAR | Status: AC
  Filled 2014-03-03: qty 3

## 2014-03-03 MED ORDER — PROPOFOL 10 MG/ML IV BOLUS
INTRAVENOUS | Status: DC | PRN
Start: 2014-03-03 — End: 2014-03-03
  Administered 2014-03-03: 200 mg via INTRAVENOUS

## 2014-03-03 MED ORDER — 0.9 % SODIUM CHLORIDE (POUR BTL) OPTIME
TOPICAL | Status: DC | PRN
  Administered 2014-03-03: 1000 mL

## 2014-03-03 MED ORDER — MIDAZOLAM HCL 2 MG/2ML IJ SOLN
INTRAMUSCULAR | Status: AC
  Filled 2014-03-03: qty 2

## 2014-03-03 MED ORDER — LACTATED RINGERS IR SOLN
Status: DC | PRN
  Administered 2014-03-03: 3000 mL

## 2014-03-03 MED ORDER — OXYCODONE-ACETAMINOPHEN 5-325 MG PO TABS
1.0000 | ORAL_TABLET | ORAL | Status: DC | PRN
  Administered 2014-03-03: 1 via ORAL

## 2014-03-03 MED ORDER — LIDOCAINE HCL (CARDIAC) 20 MG/ML IV SOLN
INTRAVENOUS | Status: AC
  Filled 2014-03-03: qty 5

## 2014-03-03 MED ORDER — KETOROLAC TROMETHAMINE 30 MG/ML IJ SOLN
INTRAMUSCULAR | Status: AC
  Filled 2014-03-03: qty 1

## 2014-03-03 MED ORDER — SCOPOLAMINE 1 MG/3DAYS TD PT72
MEDICATED_PATCH | TRANSDERMAL | Status: AC
  Administered 2014-03-03: 1.5 mg via TRANSDERMAL
  Filled 2014-03-03: qty 1

## 2014-03-03 MED ORDER — PROPOFOL 10 MG/ML IV EMUL
INTRAVENOUS | Status: AC
  Filled 2014-03-03: qty 20

## 2014-03-03 MED ORDER — FENTANYL CITRATE 0.05 MG/ML IJ SOLN
25.0000 ug | INTRAMUSCULAR | Status: DC | PRN
  Administered 2014-03-03 (×4): 25 ug via INTRAVENOUS

## 2014-03-03 MED ORDER — KETOROLAC TROMETHAMINE 30 MG/ML IJ SOLN
INTRAMUSCULAR | Status: DC | PRN
  Administered 2014-03-03: 30 mg via INTRAVENOUS

## 2014-03-03 MED ORDER — NEOSTIGMINE METHYLSULFATE 10 MG/10ML IV SOLN
INTRAVENOUS | Status: DC | PRN
  Administered 2014-03-03: 4 mg via INTRAVENOUS

## 2014-03-03 MED ORDER — OXYCODONE-ACETAMINOPHEN 5-325 MG PO TABS
ORAL_TABLET | ORAL | Status: AC
  Filled 2014-03-03: qty 1

## 2014-03-03 MED ORDER — FENTANYL CITRATE 0.05 MG/ML IJ SOLN
INTRAMUSCULAR | Status: AC
  Filled 2014-03-03: qty 2

## 2014-03-03 MED ORDER — FENTANYL CITRATE 0.05 MG/ML IJ SOLN
INTRAMUSCULAR | Status: DC | PRN
  Administered 2014-03-03 (×2): 100 ug via INTRAVENOUS
  Administered 2014-03-03: 50 ug via INTRAVENOUS
  Administered 2014-03-03: 100 ug via INTRAVENOUS

## 2014-03-03 MED ORDER — BUPIVACAINE HCL (PF) 0.25 % IJ SOLN
INTRAMUSCULAR | Status: DC | PRN
  Administered 2014-03-03: 20 mL

## 2014-03-03 MED ORDER — METOCLOPRAMIDE HCL 5 MG/ML IJ SOLN
10.0000 mg | Freq: Once | INTRAMUSCULAR | Status: DC | PRN
Start: 2014-03-03 — End: 2014-03-03

## 2014-03-03 MED ORDER — SCOPOLAMINE 1 MG/3DAYS TD PT72
1.0000 | MEDICATED_PATCH | Freq: Once | TRANSDERMAL | Status: DC
  Administered 2014-03-03: 1.5 mg via TRANSDERMAL

## 2014-03-03 MED ORDER — DEXAMETHASONE SODIUM PHOSPHATE 4 MG/ML IJ SOLN
INTRAMUSCULAR | Status: AC
  Filled 2014-03-03: qty 1

## 2014-03-03 SURGICAL SUPPLY — 39 items
BAG SPEC RTRVL LRG 6X4 10 (ENDOMECHANICALS) ×2
BLADE SURG 11 STRL SS (BLADE) ×4 IMPLANT
CABLE HIGH FREQUENCY MONO STRZ (ELECTRODE) IMPLANT
CATH ROBINSON RED A/P 16FR (CATHETERS) ×2 IMPLANT
CHLORAPREP W/TINT 26ML (MISCELLANEOUS) ×4 IMPLANT
CLOTH BEACON ORANGE TIMEOUT ST (SAFETY) ×4 IMPLANT
DECANTER SPIKE VIAL GLASS SM (MISCELLANEOUS) ×4 IMPLANT
DRSG COVADERM PLUS 2X2 (GAUZE/BANDAGES/DRESSINGS) ×4 IMPLANT
DRSG OPSITE POSTOP 3X4 (GAUZE/BANDAGES/DRESSINGS) ×2 IMPLANT
GLOVE BIO SURGEON STRL SZ8 (GLOVE) ×4 IMPLANT
GLOVE ORTHO TXT STRL SZ7.5 (GLOVE) ×4 IMPLANT
GOWN STRL REUS W/TWL 2XL LVL3 (GOWN DISPOSABLE) ×4 IMPLANT
GOWN STRL REUS W/TWL LRG LVL3 (GOWN DISPOSABLE) ×8 IMPLANT
LIQUID BAND (GAUZE/BANDAGES/DRESSINGS) ×4 IMPLANT
NDL EPID 17G 5 ECHO TUOHY (NEEDLE) IMPLANT
NDL SPNL 18GX3.5 QUINCKE PK (NEEDLE) IMPLANT
NEEDLE EPID 17G 5 ECHO TUOHY (NEEDLE) IMPLANT
NEEDLE INSUFFLATION 120MM (ENDOMECHANICALS) ×4 IMPLANT
NEEDLE SPNL 18GX3.5 QUINCKE PK (NEEDLE) ×4 IMPLANT
NS IRRIG 1000ML POUR BTL (IV SOLUTION) ×4 IMPLANT
PACK LAPAROSCOPY BASIN (CUSTOM PROCEDURE TRAY) ×4 IMPLANT
PAD TRENDELENBURG OR TABLE (MISCELLANEOUS) ×4 IMPLANT
POUCH SPECIMEN RETRIEVAL 10MM (ENDOMECHANICALS) ×2 IMPLANT
PROTECTOR NERVE ULNAR (MISCELLANEOUS) ×4 IMPLANT
SCISSORS LAP 5X35 DISP (ENDOMECHANICALS) ×2 IMPLANT
SET IRRIG TUBING LAPAROSCOPIC (IRRIGATION / IRRIGATOR) ×2 IMPLANT
SHEARS HARMONIC ACE PLUS 36CM (ENDOMECHANICALS) IMPLANT
SLEEVE XCEL OPT CAN 5 100 (ENDOMECHANICALS) ×2 IMPLANT
SOLUTION ELECTROLUBE (MISCELLANEOUS) IMPLANT
SUT VICRYL 0 UR6 27IN ABS (SUTURE) ×2 IMPLANT
SUT VICRYL 4-0 PS2 18IN ABS (SUTURE) ×4 IMPLANT
TOWEL OR 17X24 6PK STRL BLUE (TOWEL DISPOSABLE) ×8 IMPLANT
TRAY FOLEY CATH 14FR (SET/KITS/TRAYS/PACK) IMPLANT
TROCAR BALLN 12MMX100 BLUNT (TROCAR) ×2 IMPLANT
TROCAR XCEL NON-BLD 11X100MML (ENDOMECHANICALS) ×2 IMPLANT
TROCAR XCEL NON-BLD 5MMX100MML (ENDOMECHANICALS) ×4 IMPLANT
TROCAR XCEL OPT SLVE 5M 100M (ENDOMECHANICALS) ×1 IMPLANT
WARMER LAPAROSCOPE (MISCELLANEOUS) ×4 IMPLANT
WATER STERILE IRR 1000ML POUR (IV SOLUTION) ×4 IMPLANT

## 2014-03-03 NOTE — Discharge Instructions (Signed)
Routine instructions for laparoscopy Post Anesthesia Home Care Instructions  Activity: Get plenty of rest for the remainder of the day. A responsible adult should stay with you for 24 hours following the procedure.  For the next 24 hours, DO NOT: -Drive a car -Paediatric nurse -Drink alcoholic beverages -Take any medication unless instructed by your physician -Make any legal decisions or sign important papers.  Meals: Start with liquid foods such as gelatin or soup. Progress to regular foods as tolerated. Avoid greasy, spicy, heavy foods. If nausea and/or vomiting occur, drink only clear liquids until the nausea and/or vomiting subsides. Call your physician if vomiting continues.  Special Instructions/Symptoms: Your throat may feel dry or sore from the anesthesia or the breathing tube placed in your throat during surgery. If this causes discomfort, gargle with warm salt water. The discomfort should disappear within 24 hours.

## 2014-03-03 NOTE — Transfer of Care (Signed)
Immediate Anesthesia Transfer of Care Note  Patient: Gina Nichols  Procedure(s) Performed: Procedure(s) with comments: LAPAROSCOPY OPERATIVE (N/A) - Physician only need 1hr OR time SALPINGO OOPHORECTOMY (Right)  Patient Location: PACU  Anesthesia Type:General  Level of Consciousness: awake, alert  and oriented  Airway & Oxygen Therapy: Patient Spontanous Breathing and Patient connected to nasal cannula oxygen  Post-op Assessment: Report given to PACU RN  Post vital signs: Reviewed  Complications: No apparent anesthesia complications

## 2014-03-03 NOTE — Op Note (Addendum)
Preoperative Diagnosis:  Complex right ovarian cyat Postoperative Diagnosis:  Same Procedure:  Open laparoscopy with RSO and pelvic washings Surgeon: Cheri Fowler M.D. Assistant: Aquilla Solian M.D. Anesthesia: Gen. Endotracheal tube Findings: She had a normal abdomen and pelvis. The right ovary was significantly enlarged. Left ovary was normal. Uterus was slightly enlarged. Complications: None Estimated blood loss: Minimal  Procedure in detail:  The patient was taken to the operating room and placed in the dorsal supine position. General anesthesia was induced and she was placed in mobile stirrups. Abdomen and perineum were prepped and draped in the usual sterile fashion, bladder drained with a red Robinson catheter, Hulka tenaculum applied to the cervix for uterine manipulation. Infraumbilical skin was infiltrated with quarter percent Marcaine and a 3 cm horizontal incision was made. Dissection was carried out to the fascia which was elevated and entered sharply. Posterior sheath and peritoneum were then also elevated and entered sharply. A pursestring suture of 0 Vicryl was placed around the fascia. A Hassan cannula with a balloon was inserted. CO2 was insufflated and the laparoscope was inserted and good visualization was achieved. A 5 mm ports were placed on each side also under direct visualization. Inspection revealed the above-mentioned findings.  Pelvic washings were obtained.   The distal right fallopian tube was identified, grasped and elevated. The right infundibulopelvic ligament, mesosalpinx, proximal tube and right utero-ovarian ligament were coagulated with bipolar cautery and incised with scissors. This freed the right tube and ovary. All pedicles were inspected and found to be hemostatic. A 5 mm millimeter scope was inserted from the left and an Endobag through the umbilical trocar. The ovary was scooped into the bag and brought to the umbilical incision. Through the bag the  cyst was drained of about 100 cc of clear fluid. I was then able to deliver the tube and ovary through the incision. The previously pacing placed pursestring suture was tied and the fascia was felt to be intact. The 5 mm trocars were removed. Skin incisions were closed with interrupted subcuticular sutures of 4-0 Vicryl followed by skin adhesive and 1 dressing. The Hulka tenaculum was removed. The patient was awakened and taken to the operating stable condition after tolerating the procedure well. Counts were correct and she had PAS hose on throughout the procedure.

## 2014-03-03 NOTE — Interval H&P Note (Signed)
History and Physical Interval Note:  03/03/2014 9:38 AM  Gina Nichols  has presented today for surgery, with the diagnosis of Right Ovarian Cyst, [phone]  The various methods of treatment have been discussed with the patient and family. After consideration of risks, benefits and other options for treatment, the patient has consented to  Procedure(s) with comments: LAPAROSCOPY OPERATIVE (N/A) - Physician only need 1hr OR time SALPINGO OOPHORECTOMY (Right) as a surgical intervention .  The patient's history has been reviewed, patient examined, no change in status, stable for surgery.  I have reviewed the patient's chart and labs.  Questions were answered to the patient's satisfaction.     Embree Brawley D

## 2014-03-03 NOTE — Anesthesia Preprocedure Evaluation (Addendum)
Anesthesia Evaluation  Patient identified by MRN, date of birth, ID band Patient awake    Reviewed: Allergy & Precautions, H&P , NPO status , Patient's Chart, lab work & pertinent test results, reviewed documented beta blocker date and time   Airway Mallampati: III  TM Distance: >3 FB Neck ROM: Full    Dental no notable dental hx. (+) Teeth Intact   Pulmonary shortness of breath,  breath sounds clear to auscultation  Pulmonary exam normal       Cardiovascular + angina + dysrhythmias Rhythm:Regular Rate:Normal  S/P Ablation 3 mos ago for ?SVT. On Carvedilol   Neuro/Psych  Headaches, PSYCHIATRIC DISORDERS Depression    GI/Hepatic negative GI ROS, Neg liver ROS,   Endo/Other  diabetes, Well Controlled, Type 2, Oral Hypoglycemic Agents  Renal/GU negative Renal ROS  negative genitourinary   Musculoskeletal negative musculoskeletal ROS (+)   Abdominal (+) + obese,   Peds  Hematology negative hematology ROS (+)   Anesthesia Other Findings   Reproductive/Obstetrics Ovarian Cyst                            Anesthesia Physical Anesthesia Plan  ASA: II  Anesthesia Plan: General   Post-op Pain Management:    Induction: Intravenous  Airway Management Planned: Oral ETT  Additional Equipment:   Intra-op Plan:   Post-operative Plan: Extubation in OR  Informed Consent: I have reviewed the patients History and Physical, chart, labs and discussed the procedure including the risks, benefits and alternatives for the proposed anesthesia with the patient or authorized representative who has indicated his/her understanding and acceptance.   Dental advisory given  Plan Discussed with: Anesthesiologist, CRNA and Surgeon  Anesthesia Plan Comments:         Anesthesia Quick Evaluation

## 2014-03-03 NOTE — Anesthesia Postprocedure Evaluation (Signed)
  Anesthesia Post-op Note  Patient: Gina Nichols  Procedure(s) Performed: Procedure(s) with comments: LAPAROSCOPY OPERATIVE (N/A) - Physician only need 1hr OR time SALPINGO OOPHORECTOMY (Right)  Patient Location: PACU  Anesthesia Type:General  Level of Consciousness: awake, alert  and oriented  Airway and Oxygen Therapy: Patient Spontanous Breathing  Post-op Pain: mild  Post-op Assessment: Post-op Vital signs reviewed, Patient's Cardiovascular Status Stable, Respiratory Function Stable, Patent Airway, No signs of Nausea or vomiting and Pain level controlled  Post-op Vital Signs: Reviewed and stable  Last Vitals:  Filed Vitals:   03/03/14 1200  BP: 113/66  Pulse: 56  Temp:   Resp: 15    Complications: No apparent anesthesia complications

## 2014-03-04 ENCOUNTER — Encounter (HOSPITAL_COMMUNITY): Payer: Self-pay | Admitting: Obstetrics and Gynecology

## 2014-03-04 MED FILL — Heparin Sodium (Porcine) Inj 5000 Unit/ML: INTRAMUSCULAR | Qty: 1 | Status: AC

## 2014-03-17 DIAGNOSIS — M542 Cervicalgia: Secondary | ICD-10-CM | POA: Diagnosis not present

## 2014-03-18 DIAGNOSIS — M542 Cervicalgia: Secondary | ICD-10-CM | POA: Diagnosis not present

## 2014-04-14 DIAGNOSIS — N898 Other specified noninflammatory disorders of vagina: Secondary | ICD-10-CM | POA: Diagnosis not present

## 2014-04-14 DIAGNOSIS — Z113 Encounter for screening for infections with a predominantly sexual mode of transmission: Secondary | ICD-10-CM | POA: Diagnosis not present

## 2014-04-18 DIAGNOSIS — M542 Cervicalgia: Secondary | ICD-10-CM | POA: Diagnosis not present

## 2014-04-24 DIAGNOSIS — M961 Postlaminectomy syndrome, not elsewhere classified: Secondary | ICD-10-CM | POA: Diagnosis not present

## 2014-04-24 DIAGNOSIS — G894 Chronic pain syndrome: Secondary | ICD-10-CM | POA: Diagnosis not present

## 2014-04-24 DIAGNOSIS — M5106 Intervertebral disc disorders with myelopathy, lumbar region: Secondary | ICD-10-CM | POA: Diagnosis not present

## 2014-05-17 DIAGNOSIS — M542 Cervicalgia: Secondary | ICD-10-CM | POA: Diagnosis not present

## 2014-06-17 DIAGNOSIS — M542 Cervicalgia: Secondary | ICD-10-CM | POA: Diagnosis not present

## 2014-06-23 DIAGNOSIS — M961 Postlaminectomy syndrome, not elsewhere classified: Secondary | ICD-10-CM | POA: Diagnosis not present

## 2014-06-23 DIAGNOSIS — G894 Chronic pain syndrome: Secondary | ICD-10-CM | POA: Diagnosis not present

## 2014-07-17 DIAGNOSIS — M542 Cervicalgia: Secondary | ICD-10-CM | POA: Diagnosis not present

## 2014-08-01 DIAGNOSIS — H40023 Open angle with borderline findings, high risk, bilateral: Secondary | ICD-10-CM | POA: Diagnosis not present

## 2014-08-17 DIAGNOSIS — M542 Cervicalgia: Secondary | ICD-10-CM | POA: Diagnosis not present

## 2014-08-21 DIAGNOSIS — G894 Chronic pain syndrome: Secondary | ICD-10-CM | POA: Diagnosis not present

## 2014-08-21 DIAGNOSIS — M961 Postlaminectomy syndrome, not elsewhere classified: Secondary | ICD-10-CM | POA: Diagnosis not present

## 2014-09-16 DIAGNOSIS — M542 Cervicalgia: Secondary | ICD-10-CM | POA: Diagnosis not present

## 2014-11-03 DIAGNOSIS — G894 Chronic pain syndrome: Secondary | ICD-10-CM | POA: Diagnosis not present

## 2014-11-03 DIAGNOSIS — M961 Postlaminectomy syndrome, not elsewhere classified: Secondary | ICD-10-CM | POA: Diagnosis not present

## 2014-12-15 DIAGNOSIS — Z01419 Encounter for gynecological examination (general) (routine) without abnormal findings: Secondary | ICD-10-CM | POA: Diagnosis not present

## 2014-12-15 DIAGNOSIS — Z124 Encounter for screening for malignant neoplasm of cervix: Secondary | ICD-10-CM | POA: Diagnosis not present

## 2014-12-15 DIAGNOSIS — Z1231 Encounter for screening mammogram for malignant neoplasm of breast: Secondary | ICD-10-CM | POA: Diagnosis not present

## 2015-02-09 DIAGNOSIS — G894 Chronic pain syndrome: Secondary | ICD-10-CM | POA: Diagnosis not present

## 2015-02-09 DIAGNOSIS — M961 Postlaminectomy syndrome, not elsewhere classified: Secondary | ICD-10-CM | POA: Diagnosis not present

## 2015-02-16 DIAGNOSIS — M542 Cervicalgia: Secondary | ICD-10-CM | POA: Diagnosis not present

## 2015-03-19 DIAGNOSIS — M542 Cervicalgia: Secondary | ICD-10-CM | POA: Diagnosis not present

## 2015-03-26 DIAGNOSIS — M4317 Spondylolisthesis, lumbosacral region: Secondary | ICD-10-CM | POA: Diagnosis not present

## 2015-04-19 DIAGNOSIS — M542 Cervicalgia: Secondary | ICD-10-CM | POA: Diagnosis not present

## 2015-05-12 DIAGNOSIS — G894 Chronic pain syndrome: Secondary | ICD-10-CM | POA: Diagnosis not present

## 2015-05-12 DIAGNOSIS — M961 Postlaminectomy syndrome, not elsewhere classified: Secondary | ICD-10-CM | POA: Diagnosis not present

## 2015-05-17 DIAGNOSIS — M542 Cervicalgia: Secondary | ICD-10-CM | POA: Diagnosis not present

## 2015-06-08 DIAGNOSIS — H40011 Open angle with borderline findings, low risk, right eye: Secondary | ICD-10-CM | POA: Diagnosis not present

## 2015-06-12 ENCOUNTER — Encounter (HOSPITAL_BASED_OUTPATIENT_CLINIC_OR_DEPARTMENT_OTHER): Payer: Self-pay | Admitting: *Deleted

## 2015-06-12 ENCOUNTER — Emergency Department (HOSPITAL_BASED_OUTPATIENT_CLINIC_OR_DEPARTMENT_OTHER): Payer: Medicare Other

## 2015-06-12 ENCOUNTER — Emergency Department (HOSPITAL_BASED_OUTPATIENT_CLINIC_OR_DEPARTMENT_OTHER)
Admission: EM | Admit: 2015-06-12 | Discharge: 2015-06-12 | Disposition: A | Discharge status: Home / Self Care | Payer: Medicare Other | Attending: Emergency Medicine | Admitting: Emergency Medicine

## 2015-06-12 DIAGNOSIS — M25532 Pain in left wrist: Secondary | ICD-10-CM | POA: Diagnosis not present

## 2015-06-12 DIAGNOSIS — Z8679 Personal history of other diseases of the circulatory system: Secondary | ICD-10-CM | POA: Insufficient documentation

## 2015-06-12 DIAGNOSIS — E119 Type 2 diabetes mellitus without complications: Secondary | ICD-10-CM | POA: Insufficient documentation

## 2015-06-12 DIAGNOSIS — Z8659 Personal history of other mental and behavioral disorders: Secondary | ICD-10-CM | POA: Diagnosis not present

## 2015-06-12 MED ORDER — IBUPROFEN 600 MG PO TABS: 600.0000 mg | ORAL_TABLET | Freq: Four times a day (QID) | ORAL | Status: DC | PRN

## 2015-06-12 MED FILL — IBUPROFEN 600 MG TABLET: 600 | 7 days supply | Qty: 30 | Fill #0

## 2015-06-12 NOTE — ED Provider Notes (Signed)
CSN: [id number]     Arrival date & time 06/12/15  1318 History   First MD Initiated Contact with Patient 06/12/15 1336     Chief Complaint  Patient presents with  . Wrist Pain     (Consider location/radiation/quality/duration/timing/severity/associated sxs/prior Treatment) Patient is a 45 y.o. female presenting with wrist pain. The history is provided by the patient and medical records. No language interpreter was used.  Wrist Pain Associated symptoms include arthralgias and myalgias.   EMIKO TALLERICO is a right-hand-dominant 45 y.o. female  with a PMH of DM who presents to the Emergency Department complaining of gradual onset of worsening left wrist pain x 3 weeks. Worse with movement. No medications or treatments for symptoms prior to arrival. No history of injury or prior injuries to this extremity. 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   Past Medical History  Diagnosis Date  . Shortness of breath dyspnea     with stairs, wakes up out of sleep, occassional wheezes noted by patient  . Diabetes mellitus without complication ([place])     pre-diabetic  . Depression     history of  . Headache     s/p brain tumor 11/1999  . Left leg numbness     s/p back surgery  . Anginal pain ([place])     Chest pressure when bending over most recent Oct. 31, 2015  . Dysrhythmia     SVT s/p ablation 2015   Past Surgical History  Procedure Laterality Date  . Appendectomy    . Brain tumor    . Back surgery    . Ablation    . Breast reduction surgery    . Colonoscopy    . Wisdom tooth extraction    . Laparoscopy N/A 03/03/2014    Procedure: LAPAROSCOPY OPERATIVE;  Surgeon: Cheri Fowler, MD;  Location: [facility];  Service: Gynecology;  Laterality: N/A;  Physician only need 1hr OR time  . Salpingoophorectomy Right 03/03/2014    Procedure: SALPINGO OOPHORECTOMY;  Surgeon: Cheri Fowler, MD;  Location: [facility];  Service: Gynecology;  Laterality: Right;    History reviewed. No pertinent family history. Social History  Substance Use Topics  . Smoking status: Never Smoker   . Smokeless tobacco: Never Used  . Alcohol Use: No   OB History    No data available     Review of Systems  Musculoskeletal: Positive for myalgias and arthralgias.  Skin: Negative for color change and wound.      Allergies  Review of patient's allergies indicates no known allergies.  Home Medications   Prior to Admission medications   Medication Sig Start Date End Date Taking? Authorizing Provider  ibuprofen (ADVIL,MOTRIN) 600 MG tablet Take 1 tablet (600 mg total) by mouth every 6 (six) hours as needed. 06/12/15   Brian Kocourek Pilcher Callia Swim, PA-C   BP 155/98 mmHg  Pulse 72  Temp(Src) 98.3 F (36.8 C) (Oral)  Resp 18  Ht 5\' 5"  (1.651 m)  Wt 105.235 kg  BMI 38.61 kg/m2  SpO2 99%  LMP  (Within Weeks) Physical Exam  Constitutional: She is oriented to person, place, and time. She appears well-developed and well-nourished.  Alert and in no acute distress  HENT:  Head: Normocephalic and atraumatic.  Cardiovascular: Normal rate, regular rhythm and normal heart sounds.  Exam reveals no gallop and no friction rub.   No murmur heard. Pulmonary/Chest: Effort normal and breath sounds normal. No respiratory distress. She has no wheezes. She has  no rales. She exhibits no tenderness.  Abdominal: Soft. She exhibits no distension. There is no tenderness.  Musculoskeletal:  No gross deformity noted. Patient has full active and passive range of motion. There is no joint effusion noted. No erythema, or warmth overlaying the joint. There is tenderness to palpation over the dorsal wrist and distal forearm musculature. The patient has normal sensation and motor function in the median, ulnar, and radial nerve distributions. There is no anatomic snuff box tenderness. The patient has normal active and passive range of motion of their digits. 2+ Radial pulse.  Neurological: She is  alert and oriented to person, place, and time.  Skin: Skin is warm and dry.  Nursing note and vitals reviewed.   ED Course  Procedures (including critical care time) Labs Review Labs Reviewed - No data to display  Imaging Review Dg Wrist Complete Left  06/12/2015  CLINICAL DATA:  Left wrist pain for several days EXAM: LEFT WRIST - COMPLETE 3+ VIEW COMPARISON:  None. FINDINGS: Four views of the left wrist submitted. No acute fracture or subluxation. No radiopaque foreign body. IMPRESSION: Negative. Electronically Signed   By: Lahoma Crocker M.D.   On: 06/12/2015 14:06   I have personally reviewed and evaluated these images and lab results as part of my medical decision-making.   EKG Interpretation None      MDM   Final diagnoses:  Left wrist pain   BRAE WIEGMANN presents for gradually worsening left wrist pain x 3 weeks. Patient works as a Merchant navy officer daily. On exam, tenderness of dorsal wrist and forearm musculature otherwise benign. Likely overuse injury. X-ray was obtained which was unremarkable. RICE and care instructions discussed. Wrist splint provided in ED. Will treat with ibuprofen. PCP follow-up strongly encouraged. Return precautions were given all questions answered.    [facility] Shenell Rogalski, PA-C 06/12/15 [place], MD 06/17/15 [phone]

## 2015-06-12 NOTE — ED Notes (Signed)
Left wrist pain x several weeks.  Denies known injury.

## 2015-06-12 NOTE — Discharge Instructions (Signed)
1. Medications: Take ibuprofen as needed for pain, continue usual home medications 2. Treatment: rest, drink plenty of fluids, ice affected area (instructions below) 3. Follow Up: Please follow up with your primary doctor in 3-5 days for discussion of your diagnoses and further evaluation after today's visit; Please return to the ER for new or worsening symptoms, any additional concerns.  COLD THERAPY DIRECTIONS:  Ice or gel packs can be used to reduce both pain and swelling. Ice is the most helpful within the first 24 to 48 hours after an injury or flareup from overusing a muscle or joint.  Ice is effective, has very few side effects, and is safe for most people to use.   If you expose your skin to cold temperatures for too long or without the proper protection, you can damage your skin or nerves. Watch for signs of skin damage due to cold.   HOME CARE INSTRUCTIONS  Follow these tips to use ice and cold packs safely.  Place a dry or damp towel between the ice and skin. A damp towel will cool the skin more quickly, so you may need to shorten the time that the ice is used.  For a more rapid response, add gentle compression to the ice.  Ice for no more than 10 to 20 minutes at a time. The bonier the area you are icing, the less time it will take to get the benefits of ice.  Check your skin after 5 minutes to make sure there are no signs of a poor response to cold or skin damage.  Rest 20 minutes or more in between uses.  Once your skin is numb, you can end your treatment. You can test numbness by very lightly touching your skin. The touch should be so light that you do not see the skin dimple from the pressure of your fingertip. When using ice, most people will feel these normal sensations in this order: cold, burning, aching, and numbness.

## 2015-06-17 DIAGNOSIS — M542 Cervicalgia: Secondary | ICD-10-CM | POA: Diagnosis not present

## 2015-07-06 DIAGNOSIS — H40023 Open angle with borderline findings, high risk, bilateral: Secondary | ICD-10-CM | POA: Diagnosis not present

## 2015-07-17 DIAGNOSIS — M542 Cervicalgia: Secondary | ICD-10-CM | POA: Diagnosis not present

## 2015-08-24 DIAGNOSIS — G894 Chronic pain syndrome: Secondary | ICD-10-CM | POA: Diagnosis not present

## 2015-08-24 DIAGNOSIS — M961 Postlaminectomy syndrome, not elsewhere classified: Secondary | ICD-10-CM | POA: Diagnosis not present

## 2015-09-16 DIAGNOSIS — R102 Pelvic and perineal pain: Secondary | ICD-10-CM | POA: Diagnosis not present

## 2015-10-12 DIAGNOSIS — J320 Chronic maxillary sinusitis: Secondary | ICD-10-CM | POA: Diagnosis not present

## 2015-10-12 DIAGNOSIS — H6980 Other specified disorders of Eustachian tube, unspecified ear: Secondary | ICD-10-CM | POA: Diagnosis not present

## 2015-11-06 IMAGING — CR DG CHEST 1V PORT
1 series · 1 of 1 positions shown · non-contrast
Comparison: None.

CLINICAL DATA: Chest pain.  Shortness of breath.

EXAM:
PORTABLE CHEST - 1 VIEW

[view not recorded]
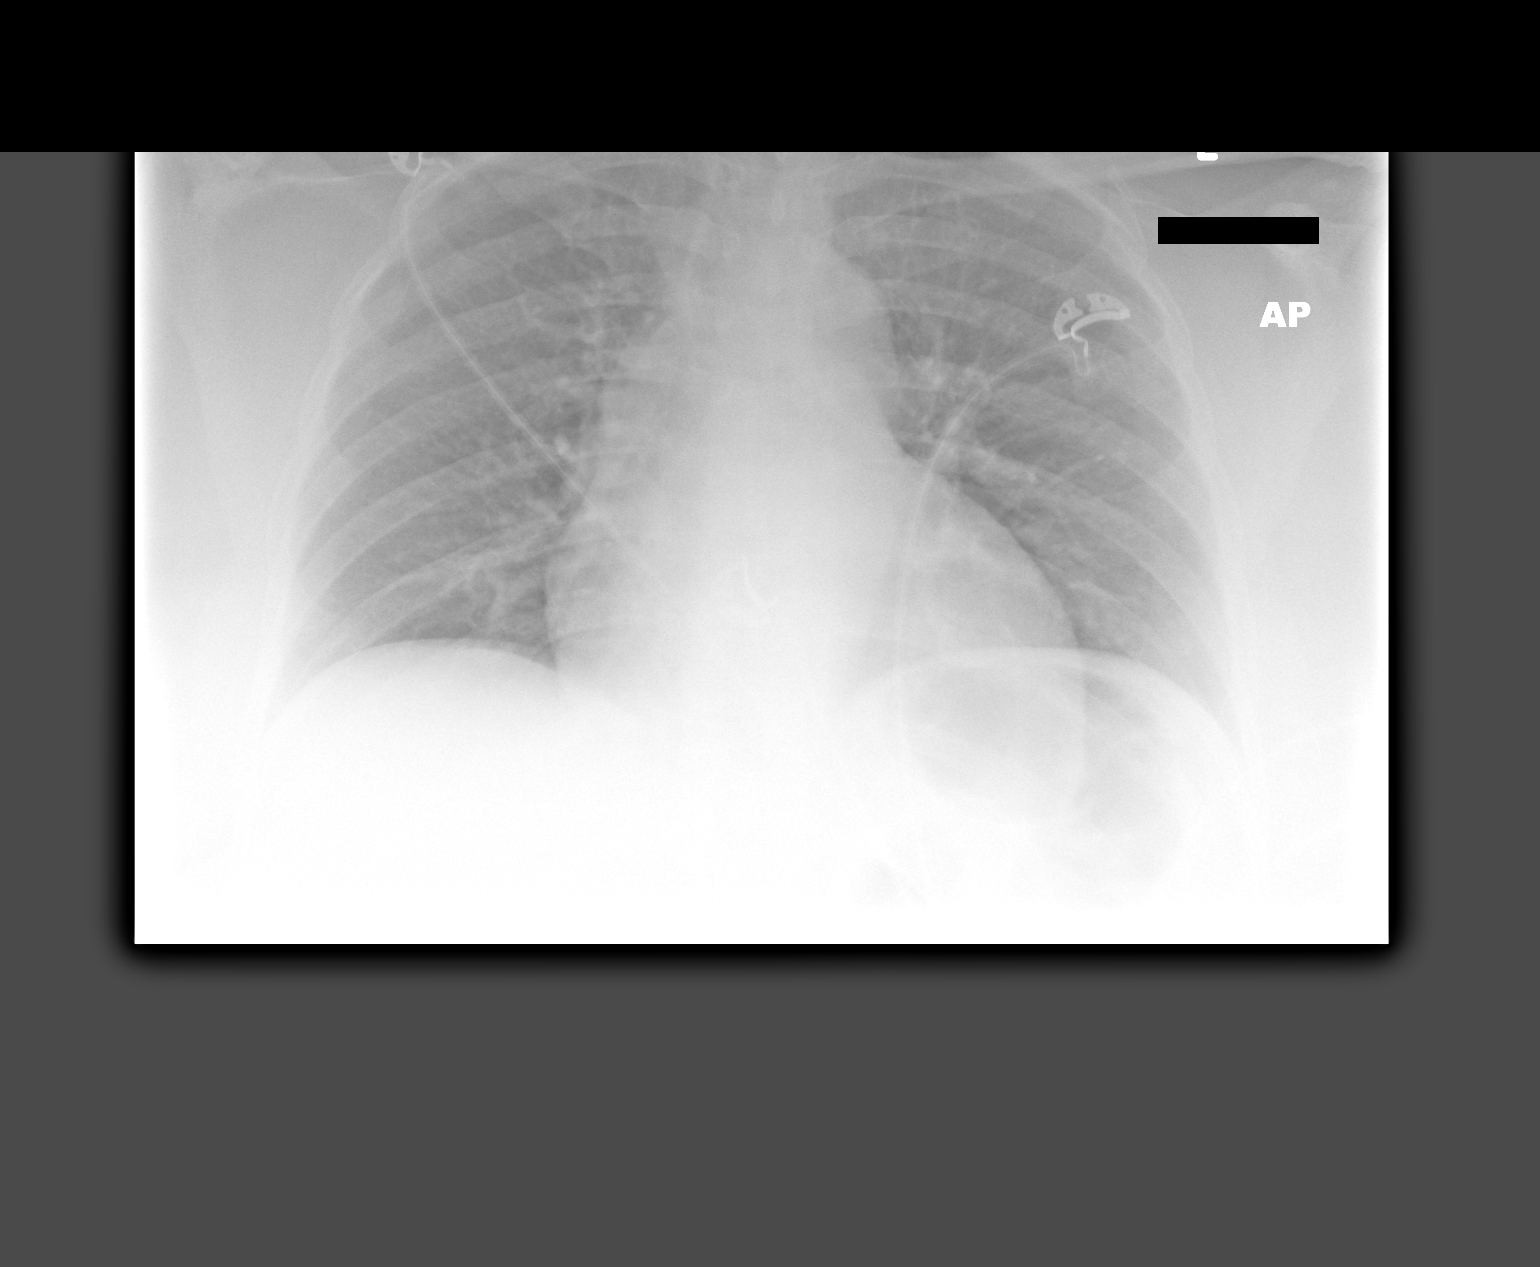

[1 of 1 positions shown; findings below may reference images not displayed]

FINDINGS: Borderline cardiomegaly. No CHF. Lungs are clear. No pneumothorax.
No acute bony abnormality.
IMPRESSION: Borderline cardiomegaly, no CHF.  No acute pulmonary disease.

## 2015-11-23 DIAGNOSIS — M961 Postlaminectomy syndrome, not elsewhere classified: Secondary | ICD-10-CM | POA: Diagnosis not present

## 2015-11-23 DIAGNOSIS — G894 Chronic pain syndrome: Secondary | ICD-10-CM | POA: Diagnosis not present

## 2015-12-30 DIAGNOSIS — H40011 Open angle with borderline findings, low risk, right eye: Secondary | ICD-10-CM | POA: Diagnosis not present

## 2016-01-04 DIAGNOSIS — Z01419 Encounter for gynecological examination (general) (routine) without abnormal findings: Secondary | ICD-10-CM | POA: Diagnosis not present

## 2016-01-04 DIAGNOSIS — Z1231 Encounter for screening mammogram for malignant neoplasm of breast: Secondary | ICD-10-CM | POA: Diagnosis not present

## 2016-01-04 DIAGNOSIS — L918 Other hypertrophic disorders of the skin: Secondary | ICD-10-CM | POA: Diagnosis not present

## 2016-01-17 DIAGNOSIS — M542 Cervicalgia: Secondary | ICD-10-CM | POA: Diagnosis not present

## 2016-02-22 DIAGNOSIS — M961 Postlaminectomy syndrome, not elsewhere classified: Secondary | ICD-10-CM | POA: Diagnosis not present

## 2016-02-22 DIAGNOSIS — G894 Chronic pain syndrome: Secondary | ICD-10-CM | POA: Diagnosis not present

## 2016-05-16 DIAGNOSIS — G894 Chronic pain syndrome: Secondary | ICD-10-CM | POA: Diagnosis not present

## 2016-05-16 DIAGNOSIS — M961 Postlaminectomy syndrome, not elsewhere classified: Secondary | ICD-10-CM | POA: Diagnosis not present

## 2016-06-27 DIAGNOSIS — H40021 Open angle with borderline findings, high risk, right eye: Secondary | ICD-10-CM | POA: Diagnosis not present

## 2016-07-16 DIAGNOSIS — M542 Cervicalgia: Secondary | ICD-10-CM | POA: Diagnosis not present

## 2016-07-18 DIAGNOSIS — G894 Chronic pain syndrome: Secondary | ICD-10-CM | POA: Diagnosis not present

## 2016-07-18 DIAGNOSIS — M961 Postlaminectomy syndrome, not elsewhere classified: Secondary | ICD-10-CM | POA: Diagnosis not present

## 2016-08-03 ENCOUNTER — Emergency Department (HOSPITAL_BASED_OUTPATIENT_CLINIC_OR_DEPARTMENT_OTHER)
Admission: EM | Admit: 2016-08-03 | Discharge: 2016-08-03 | Disposition: A | Discharge status: Home / Self Care | Payer: Medicare Other | Attending: Physician Assistant | Admitting: Physician Assistant

## 2016-08-03 ENCOUNTER — Encounter (HOSPITAL_BASED_OUTPATIENT_CLINIC_OR_DEPARTMENT_OTHER): Payer: Self-pay

## 2016-08-03 DIAGNOSIS — Z79899 Other long term (current) drug therapy: Secondary | ICD-10-CM | POA: Diagnosis not present

## 2016-08-03 DIAGNOSIS — S80862A Insect bite (nonvenomous), left lower leg, initial encounter: Secondary | ICD-10-CM | POA: Insufficient documentation

## 2016-08-03 DIAGNOSIS — Y939 Activity, unspecified: Secondary | ICD-10-CM | POA: Insufficient documentation

## 2016-08-03 DIAGNOSIS — Y929 Unspecified place or not applicable: Secondary | ICD-10-CM | POA: Insufficient documentation

## 2016-08-03 DIAGNOSIS — W57XXXA Bitten or stung by nonvenomous insect and other nonvenomous arthropods, initial encounter: Secondary | ICD-10-CM | POA: Diagnosis not present

## 2016-08-03 DIAGNOSIS — E119 Type 2 diabetes mellitus without complications: Secondary | ICD-10-CM | POA: Insufficient documentation

## 2016-08-03 DIAGNOSIS — Y999 Unspecified external cause status: Secondary | ICD-10-CM | POA: Insufficient documentation

## 2016-08-03 MED ORDER — CEPHALEXIN 500 MG PO CAPS: 500.0000 mg | ORAL_CAPSULE | Freq: Four times a day (QID) | ORAL | 0 refills | Status: DC

## 2016-08-03 NOTE — Discharge Instructions (Signed)
Apply warm compresses to the area to to help with the symptoms.   Like we discussed, if your symptoms begin to worsen, you can take the antibiotics.   Return to the Emergency Department for any worsening fever, redeness, swelling or any other worsening or concerning symptoms.

## 2016-08-03 NOTE — ED Provider Notes (Signed)
[facility] DEPT [facility] Provider Note   CSN: [id number] Arrival date & time: 08/03/16  2020  By signing my name below, I, Collene Leyden, attest that this documentation has been prepared under the direction and in the presence of Providence Lanius, [place] . Electronically Signed: Collene Leyden, Scribe. 08/03/16. 10:28 PM.  History   Chief Complaint Chief Complaint  Patient presents with  . Insect Bite    HPI Comments: Gina Nichols is a 46 y.o. female with a history of DM, who presents to the Emergency Department complaining of an area of pruritus and redness to left anterior leg that began yesterday. Patient states that she thinks she was bitten by an insect and She is worried that is brown recluse.Patient reports associated erythema and pain. No modifying factors indicated. Patient is able to ambulate without difficulty. She is not tried any medication. Patient denies any numbness, weakness, fever, or chills.   The history is provided by the patient. No language interpreter was used.    Past Medical History:  Diagnosis Date  . Anginal pain ([place])    Chest pressure when bending over most recent Oct. 31, 2015  . Depression    history of  . Diabetes mellitus without complication ([place])    pre-diabetic  . Dysrhythmia    SVT s/p ablation 2015  . Headache    s/p brain tumor 11/1999  . Left leg numbness    s/p back surgery  . Shortness of breath dyspnea    with stairs, wakes up out of sleep, occassional wheezes noted by patient    Patient Active Problem List   Diagnosis Date Noted  . Right ovarian cyst 03/03/2014    Past Surgical History:  Procedure Laterality Date  . ABLATION    . APPENDECTOMY    . BACK SURGERY    . brain tumor    . BREAST REDUCTION SURGERY    . COLONOSCOPY    . LAPAROSCOPY N/A 03/03/2014   Procedure: LAPAROSCOPY OPERATIVE;  Surgeon: Cheri Fowler, MD;  Location: [facility];  Service: Gynecology;  Laterality: N/A;  Physician only need 1hr OR time  .  SALPINGOOPHORECTOMY Right 03/03/2014   Procedure: SALPINGO OOPHORECTOMY;  Surgeon: Cheri Fowler, MD;  Location: [facility];  Service: Gynecology;  Laterality: Right;  . WISDOM TOOTH EXTRACTION      OB History    No data available       Home Medications    Prior to Admission medications   Medication Sig Start Date End Date Taking? Authorizing Provider  NAPROXEN PO Take by mouth.   Yes [provider]  OXYCODONE HCL PO Take by mouth.   Yes [provider]  TIZANIDINE HCL PO Take by mouth.   Yes [provider]  cephALEXin (KEFLEX) 500 MG capsule Take 1 capsule (500 mg total) by mouth 4 (four) times daily. 08/03/16   Volanda Napoleon, PA-C    Family History No family history on file.  Social History Social History  Substance Use Topics  . Smoking status: Never Smoker  . Smokeless tobacco: Never Used  . Alcohol use No     Allergies   Patient has no known allergies.   Review of Systems Review of Systems  Constitutional: Negative for fever.  Gastrointestinal: Negative for vomiting.  Skin: Positive for color change.  Neurological: Negative for weakness and numbness.  All other systems reviewed and are negative.    Physical Exam Updated Vital Signs BP (!) 149/93 (BP Location: Left Arm)   Pulse  88   Temp 98.9 F (37.2 C) (Oral)   Resp 20   Ht 5\' 5"  (1.651 m)   Wt 108.5 kg (239 lb 3.2 oz)   LMP 07/27/2016   SpO2 100%   BMI 39.80 kg/m   Physical Exam  Constitutional: She appears well-developed and well-nourished.  Sitting comfortably on examination table  HENT:  Head: Normocephalic and atraumatic.  Eyes: Conjunctivae and EOM are normal. Right eye exhibits no discharge. Left eye exhibits no discharge. No scleral icterus.  Pulmonary/Chest: Effort normal.  Musculoskeletal: She exhibits no deformity.  Neurological: She is alert.  Skin: Skin is warm and dry. Capillary refill takes less than 2 seconds.  Small, 1 cm circular area of  erythema to the anterior aspect of left shin. No surrounding warmth, induration, tenderness. No fluctuance noted.  Psychiatric: She has a normal mood and affect. Her speech is normal and behavior is normal.  Nursing note and vitals reviewed.    ED Treatments / Results  DIAGNOSTIC STUDIES: Oxygen Saturation is 100% on RA, normal by my interpretation.    COORDINATION OF CARE: 10:28 PM Discussed treatment plan with pt at bedside and pt agreed to plan, which includes antibiotics.   Labs (all labs ordered are listed, but only abnormal results are displayed) Labs Reviewed - No data to display  EKG  EKG Interpretation None       Radiology No results found.  Procedures Procedures (including critical care time)  Medications Ordered in ED Medications - No data to display   Initial Impression / Assessment and Plan / ED Course  I have reviewed the triage vital signs and the nursing notes.  Pertinent labs & imaging results that were available during my care of the patient were reviewed by me and considered in my medical decision making (see chart for details).     46 year old female who presents with insect bite that occurred yesterday. History/physical exam are consistent with an insect bite. She does have some surrounding erythema. There does not appear to be any formed abscess or anything that would require drainage in the emergency department. Patient is worried and wants this is a brown recluse bite. We discussed at length regarding insect bite. I started her that it does not appear to be a brown recluse insect. She states that she did not visualize any brown recluse biting her.   I discussed with patient regarding use of warm compresses and monitoring it. She is very concerned about potentially spreading and causing a worsening infection. Explained her that given the appearance and small nature we can continue to monitor it for any worsening symptoms. Patient states that she would  prefer to have an antibiotic at this time to stop the spreading of it. We discussed at length regarding the need for antibiotic. Wll plan to write her a prescription for short course of Keflex for any surrounding cellulitis. Instructed patient to wait a few days and let it heal and if it starts getting worse then she can start taking the antibiotics. Instructed patient to follow-up with her primary care doctor in 24-48 hours for further evaluation. Strict return precautions discussed. Patient was understanding and agreement to plan.    Final Clinical Impressions(s) / ED Diagnoses   Final diagnoses:  Insect bite, initial encounter    New Prescriptions New Prescriptions   CEPHALEXIN (KEFLEX) 500 MG CAPSULE    Take 1 capsule (500 mg total) by mouth 4 (four) times daily.   I personally performed the services described in  this documentation, which was scribed in my presence. The recorded information has been reviewed and is accurate.     Volanda Napoleon, PA-C 08/03/16 2314    Macarthur Critchley, MD 08/07/16 0830

## 2016-08-03 NOTE — ED Notes (Signed)
Red, elevated spot to left anterior leg measuring 1.5 cm x 1 cm.  Patient claimed that site is itching and stinging.

## 2016-08-03 NOTE — ED Triage Notes (Signed)
C/o area to left LE that appeared to look like mosquito bite 3 days ago-area is now red and increased in size-NAD-steady gait

## 2016-11-28 DIAGNOSIS — N951 Menopausal and female climacteric states: Secondary | ICD-10-CM | POA: Diagnosis not present

## 2016-11-29 DIAGNOSIS — R232 Flushing: Secondary | ICD-10-CM | POA: Diagnosis not present

## 2016-11-29 DIAGNOSIS — M2550 Pain in unspecified joint: Secondary | ICD-10-CM | POA: Diagnosis not present

## 2016-11-29 DIAGNOSIS — R5383 Other fatigue: Secondary | ICD-10-CM | POA: Diagnosis not present

## 2016-11-30 DIAGNOSIS — R232 Flushing: Secondary | ICD-10-CM | POA: Diagnosis not present

## 2016-11-30 DIAGNOSIS — M2550 Pain in unspecified joint: Secondary | ICD-10-CM | POA: Diagnosis not present

## 2016-12-19 DIAGNOSIS — E039 Hypothyroidism, unspecified: Secondary | ICD-10-CM | POA: Diagnosis not present

## 2016-12-19 DIAGNOSIS — H40021 Open angle with borderline findings, high risk, right eye: Secondary | ICD-10-CM | POA: Diagnosis not present

## 2016-12-26 DIAGNOSIS — R232 Flushing: Secondary | ICD-10-CM | POA: Diagnosis not present

## 2016-12-26 DIAGNOSIS — E039 Hypothyroidism, unspecified: Secondary | ICD-10-CM | POA: Diagnosis not present

## 2016-12-26 DIAGNOSIS — M2550 Pain in unspecified joint: Secondary | ICD-10-CM | POA: Diagnosis not present

## 2017-01-02 DIAGNOSIS — E039 Hypothyroidism, unspecified: Secondary | ICD-10-CM | POA: Diagnosis not present

## 2017-01-02 DIAGNOSIS — R5383 Other fatigue: Secondary | ICD-10-CM | POA: Diagnosis not present

## 2017-01-02 DIAGNOSIS — M2550 Pain in unspecified joint: Secondary | ICD-10-CM | POA: Diagnosis not present

## 2017-01-09 DIAGNOSIS — E039 Hypothyroidism, unspecified: Secondary | ICD-10-CM | POA: Diagnosis not present

## 2017-01-10 ENCOUNTER — Emergency Department (HOSPITAL_BASED_OUTPATIENT_CLINIC_OR_DEPARTMENT_OTHER)
Admission: EM | Admit: 2017-01-10 | Discharge: 2017-01-10 | Disposition: A | Discharge status: Home / Self Care | Payer: Medicare Other | Attending: Emergency Medicine | Admitting: Emergency Medicine

## 2017-01-10 ENCOUNTER — Encounter (HOSPITAL_BASED_OUTPATIENT_CLINIC_OR_DEPARTMENT_OTHER): Payer: Self-pay

## 2017-01-10 DIAGNOSIS — Z79899 Other long term (current) drug therapy: Secondary | ICD-10-CM | POA: Diagnosis not present

## 2017-01-10 DIAGNOSIS — F329 Major depressive disorder, single episode, unspecified: Secondary | ICD-10-CM | POA: Diagnosis not present

## 2017-01-10 DIAGNOSIS — R7303 Prediabetes: Secondary | ICD-10-CM | POA: Diagnosis not present

## 2017-01-10 DIAGNOSIS — M25562 Pain in left knee: Secondary | ICD-10-CM | POA: Insufficient documentation

## 2017-01-10 MED ORDER — NAPROXEN 500 MG PO TABS: 500.0000 mg | ORAL_TABLET | Freq: Two times a day (BID) | ORAL | 0 refills | Status: DC

## 2017-01-10 MED ORDER — NAPROXEN 250 MG PO TABS
500.0000 mg | ORAL_TABLET | Freq: Once | ORAL | Status: AC
  Administered 2017-01-10: 500 mg via ORAL
  Filled 2017-01-10: qty 2

## 2017-01-10 NOTE — Discharge Instructions (Signed)
You were seen today for left knee pain.  This is likely related to some arthritis but also may be related to a ligament or meniscus injury.  Rest, ice, elevation is recommended.  Naproxen for pain.  Take twice daily for the next 5-7 days.  Make sure to rest.  Sports medicine follow-up provided above if not improving.  You may need an MRI.

## 2017-01-10 NOTE — ED Triage Notes (Signed)
Pt reports injuring her knee working out several months ago. Pt able to ambulate and denies pain on assessment, but reports that when trying to get in the car or go up steps she has pain 10/10. Pt went to gym this morning but reports her pain has gotten worse the last few days.

## 2017-01-10 NOTE — ED Notes (Signed)
Pt ambulated to treatment room with steady gate

## 2017-01-10 NOTE — ED Provider Notes (Signed)
[facility] EMERGENCY DEPARTMENT Provider Note   CSN: [id number] Arrival date & time: 01/10/17  8099     History   Chief Complaint Chief Complaint  Patient presents with  . Knee Pain    HPI Gina Nichols is a 46 y.o. female.  HPI This is a 47 year old female who presents with left knee pain.  Patient reports 1 month ago she was working out when she felt a pop.  She had pain following that.  Since that time she has had intermittent left knee pain that is exacerbated with walking up steps, working out, bending.  Currently she is without pain but prior to arrival she was working out and states that she was unable to do her workout secondary to pain.  She has iced her knee which has helped on occasion.  She has not noted any swelling or skin changes to the knee.  She does report a remote injury and dislocation.  No new injury today.   Past Medical History:  Diagnosis Date  . Anginal pain ([place])    Chest pressure when bending over most recent Oct. 31, 2015  . Depression    history of  . Diabetes mellitus without complication ([place])    pre-diabetic  . Dysrhythmia    SVT s/p ablation 2015  . Headache    s/p brain tumor 11/1999  . Left leg numbness    s/p back surgery  . Shortness of breath dyspnea    with stairs, wakes up out of sleep, occassional wheezes noted by patient    Patient Active Problem List   Diagnosis Date Noted  . Right ovarian cyst 03/03/2014    Past Surgical History:  Procedure Laterality Date  . ABLATION    . APPENDECTOMY    . BACK SURGERY    . brain tumor    . BREAST REDUCTION SURGERY    . COLONOSCOPY    . LAPAROSCOPY N/A 03/03/2014   Procedure: LAPAROSCOPY OPERATIVE;  Surgeon: Cheri Fowler, MD;  Location: [facility];  Service: Gynecology;  Laterality: N/A;  Physician only need 1hr OR time  . SALPINGOOPHORECTOMY Right 03/03/2014   Procedure: SALPINGO OOPHORECTOMY;  Surgeon: Cheri Fowler, MD;  Location: [facility];  Service: Gynecology;   Laterality: Right;  . WISDOM TOOTH EXTRACTION      OB History    No data available       Home Medications    Prior to Admission medications   Medication Sig Start Date End Date Taking? Authorizing Provider  cephALEXin (KEFLEX) 500 MG capsule Take 1 capsule (500 mg total) by mouth 4 (four) times daily. 08/03/16  Yes Volanda Napoleon, PA-C  cholecalciferol (VITAMIN D) 1000 units tablet Take 1,000 Units by mouth daily.   Yes [provider]  OXYCODONE HCL PO Take by mouth.   Yes [provider]  naproxen (NAPROSYN) 500 MG tablet Take 1 tablet (500 mg total) by mouth 2 (two) times daily. 01/10/17   Jacarius Handel, Barbette Hair, MD  NAPROXEN PO Take by mouth.    [provider]  TIZANIDINE HCL PO Take by mouth.    [provider]    Family History No family history on file.  Social History Social History  Substance Use Topics  . Smoking status: Never Smoker  . Smokeless tobacco: Never Used  . Alcohol use No     Allergies   Patient has no known allergies.   Review of Systems Review of Systems  Musculoskeletal:  Knee Pain  Neurological: Negative for weakness and numbness.  All other systems reviewed and are negative.    Physical Exam Updated Vital Signs BP (!) 156/112 (BP Location: Right Arm)   Pulse 88   Temp 98.3 F (36.8 C) (Oral)   Resp 18   Ht 5\' 5"  (1.651 m)   Wt 108.4 kg (239 lb)   LMP 01/03/2017   SpO2 98%   BMI 39.77 kg/m   Physical Exam  Constitutional: She is oriented to person, place, and time. She appears well-developed and well-nourished.  Overweight, no acute distress  HENT:  Head: Normocephalic and atraumatic.  Cardiovascular: Normal rate and regular rhythm.   Pulmonary/Chest: Effort normal. No respiratory distress.  Musculoskeletal:  Normal range of motion of the left knee, no obvious swelling or overlying skin changes, no warmth or erythema, crepitus noted with flexion, no joint laxity noted, no joint line  tenderness, no obvious deformities, 2+ DP pulse  Neurological: She is alert and oriented to person, place, and time.  Skin: Skin is warm and dry.  Psychiatric: She has a normal mood and affect.  Nursing note and vitals reviewed.    ED Treatments / Results  Labs (all labs ordered are listed, but only abnormal results are displayed) Labs Reviewed - No data to display  EKG  EKG Interpretation None       Radiology No results found.  Procedures Procedures (including critical care time)  Medications Ordered in ED Medications  naproxen (NAPROSYN) tablet 500 mg (not administered)     Initial Impression / Assessment and Plan / ED Course  I have reviewed the triage vital signs and the nursing notes.  Pertinent labs & imaging results that were available during my care of the patient were reviewed by me and considered in my medical decision making (see chart for details).     Patient presents with acute on chronic left knee pain.  Reports 1 month ago hearing a pop and having pain.  She does have some crepitus in the knee which is suggestive of arthritis.  This is likely especially given her old injury.  However, given the injury and association of new pain without injury, meniscus or ligamentous injury is also a consideration.  Feel that x-rays would be low yield at this time and would not change management.  Recommend anti-inflammatories, ice, rest.  Follow-up with sports medicine for further evaluation and possible MRI.  No indication of active infection.  After history, exam, and medical workup I feel the patient has been appropriately medically screened and is safe for discharge home. Pertinent diagnoses were discussed with the patient. Patient was given return precautions.   Final Clinical Impressions(s) / ED Diagnoses   Final diagnoses:  Acute pain of left knee    New Prescriptions New Prescriptions   NAPROXEN (NAPROSYN) 500 MG TABLET    Take 1 tablet (500 mg total) by  mouth 2 (two) times daily.     Merryl Hacker, MD 01/10/17 [phone]

## 2017-01-11 ENCOUNTER — Encounter: Payer: Self-pay | Admitting: Family Medicine

## 2017-01-11 ENCOUNTER — Ambulatory Visit (INDEPENDENT_AMBULATORY_CARE_PROVIDER_SITE_OTHER): Payer: Medicare Other | Admitting: Family Medicine

## 2017-01-11 DIAGNOSIS — M25562 Pain in left knee: Secondary | ICD-10-CM | POA: Insufficient documentation

## 2017-01-11 NOTE — Assessment & Plan Note (Signed)
consistent with patellofemoral syndrome.  Exam is reassuring.  Shown home exercises to do daily.  Cross train if needed.  Consider arch supports.  Icing, tylenol or ibuprofen.  Knee sleeve for support.  F/u in 6 weeks.  Consider physical therapy if not improving.

## 2017-01-11 NOTE — Progress Notes (Signed)
PCP: Benito Mccreedy, MD  Subjective:   HPI: Patient is a 46 y.o. female here for left knee pain.  Patient reports about 1 month ago she was training in the gym and felt a pop anterior left knee. Doesn't recall what activity she was doing at the time - she has a trainer who works with her. Since then has had pain with stairs, getting in and out of a car, squatting, sitting down. Has tried icing and ibuprofen which help. Pain level currently 0/10 but can be sharp and anterior. Knee has buckled on her. No skin changes, numbness.  Past Medical History:  Diagnosis Date  . Anginal pain ([place])    Chest pressure when bending over most recent Oct. 31, 2015  . Depression    history of  . Diabetes mellitus without complication ([place])    pre-diabetic  . Dysrhythmia    SVT s/p ablation 2015  . Headache    s/p brain tumor 11/1999  . Left leg numbness    s/p back surgery  . Shortness of breath dyspnea    with stairs, wakes up out of sleep, occassional wheezes noted by patient    Current Outpatient Prescriptions on File Prior to Visit  Medication Sig Dispense Refill  . cholecalciferol (VITAMIN D) 1000 units tablet Take 1,000 Units by mouth daily.    . naproxen (NAPROSYN) 500 MG tablet Take 1 tablet (500 mg total) by mouth 2 (two) times daily. 30 tablet 0   No current facility-administered medications on file prior to visit.     Past Surgical History:  Procedure Laterality Date  . ABLATION    . APPENDECTOMY    . BACK SURGERY    . brain tumor    . BREAST REDUCTION SURGERY    . COLONOSCOPY    . LAPAROSCOPY N/A 03/03/2014   Procedure: LAPAROSCOPY OPERATIVE;  Surgeon: Cheri Fowler, MD;  Location: [facility];  Service: Gynecology;  Laterality: N/A;  Physician only need 1hr OR time  . SALPINGOOPHORECTOMY Right 03/03/2014   Procedure: SALPINGO OOPHORECTOMY;  Surgeon: Cheri Fowler, MD;  Location: [facility];  Service: Gynecology;  Laterality: Right;  . WISDOM TOOTH EXTRACTION      No  Known Allergies  Social History   Social History  . Marital status: Single    Spouse name: N/A  . Number of children: N/A  . Years of education: N/A   Occupational History  . Not on file.   Social History Main Topics  . Smoking status: Never Smoker  . Smokeless tobacco: Never Used  . Alcohol use No  . Drug use: No  . Sexual activity: Not on file   Other Topics Concern  . Not on file   Social History Narrative  . No narrative on file    No family history on file.  Pulse 71   Ht 5\' 5"  (1.651 m)   Wt 237 lb (107.5 kg)   LMP 01/03/2017   BMI 39.44 kg/m   Review of Systems: See HPI above.     Objective:  Physical Exam:  Gen: NAD, comfortable in exam room  Left knee: No gross deformity, ecchymoses, effusion. Mild TTP post patellar facets. FROM. Negative ant/post drawers. Negative valgus/varus testing. Negative lachmanns. Negative mcmurrays, apleys, patellar apprehension. NV intact distally.  Right knee: No gross deformity, ecchymoses, swelling. No TTP. FROM. Negative ant/post drawers. Negative valgus/varus testing. Negative lachmanns. NV intact distally.   Assessment & Plan:  1. Left knee pain - consistent with patellofemoral syndrome.  Exam  is reassuring.  Shown home exercises to do daily.  Cross train if needed.  Consider arch supports.  Icing, tylenol or ibuprofen.  Knee sleeve for support.  F/u in 6 weeks.  Consider physical therapy if not improving.

## 2017-01-11 NOTE — Patient Instructions (Signed)
You have patellofemoral syndrome. Avoid painful activities when possible (often deep squats, lunges bother this). Cross train with swimming, cycling with low resistance, elliptical if needed. Straight leg raise, hip side raises, knee extensions, straight leg raises with foot turned outwards 3 sets of 10 once a day. Add ankle weight if these become too easy. Correct foot breakdown with something like dr. Zoe Lan active series, spencos, or our green sports insoles (the shoes you have on now have good arch supports). Avoid flat shoes, barefoot walking as much as possible. Icing 15 minutes at a time 3-4 times a day as needed. Tylenol or ibuprofen as needed for pain. Consider knee sleeve with the kneecap cut out. Follow up with me in 6 weeks. Consider physical therapy if you're not improving as expected

## 2017-01-16 DIAGNOSIS — M542 Cervicalgia: Secondary | ICD-10-CM | POA: Diagnosis not present

## 2017-01-16 DIAGNOSIS — E039 Hypothyroidism, unspecified: Secondary | ICD-10-CM | POA: Diagnosis not present

## 2017-01-23 DIAGNOSIS — R030 Elevated blood-pressure reading, without diagnosis of hypertension: Secondary | ICD-10-CM | POA: Diagnosis not present

## 2017-01-23 DIAGNOSIS — E039 Hypothyroidism, unspecified: Secondary | ICD-10-CM | POA: Diagnosis not present

## 2017-02-20 ENCOUNTER — Ambulatory Visit: Payer: Self-pay | Admitting: Family Medicine

## 2017-04-22 ENCOUNTER — Encounter (HOSPITAL_BASED_OUTPATIENT_CLINIC_OR_DEPARTMENT_OTHER): Payer: Self-pay | Admitting: *Deleted

## 2017-04-22 ENCOUNTER — Other Ambulatory Visit: Payer: Self-pay

## 2017-04-22 ENCOUNTER — Emergency Department (HOSPITAL_BASED_OUTPATIENT_CLINIC_OR_DEPARTMENT_OTHER): Payer: Medicare HMO

## 2017-04-22 ENCOUNTER — Emergency Department (HOSPITAL_BASED_OUTPATIENT_CLINIC_OR_DEPARTMENT_OTHER)
Admission: EM | Admit: 2017-04-22 | Discharge: 2017-04-22 | Disposition: A | Discharge status: Home / Self Care | Payer: Medicare HMO | Attending: Physician Assistant | Admitting: Physician Assistant

## 2017-04-22 DIAGNOSIS — E119 Type 2 diabetes mellitus without complications: Secondary | ICD-10-CM | POA: Diagnosis not present

## 2017-04-22 DIAGNOSIS — B9789 Other viral agents as the cause of diseases classified elsewhere: Secondary | ICD-10-CM

## 2017-04-22 DIAGNOSIS — R05 Cough: Secondary | ICD-10-CM | POA: Diagnosis not present

## 2017-04-22 DIAGNOSIS — R079 Chest pain, unspecified: Secondary | ICD-10-CM | POA: Diagnosis not present

## 2017-04-22 DIAGNOSIS — Z79899 Other long term (current) drug therapy: Secondary | ICD-10-CM | POA: Insufficient documentation

## 2017-04-22 DIAGNOSIS — J029 Acute pharyngitis, unspecified: Secondary | ICD-10-CM | POA: Diagnosis not present

## 2017-04-22 DIAGNOSIS — R0602 Shortness of breath: Secondary | ICD-10-CM | POA: Diagnosis not present

## 2017-04-22 DIAGNOSIS — J069 Acute upper respiratory infection, unspecified: Secondary | ICD-10-CM | POA: Diagnosis not present

## 2017-04-22 DIAGNOSIS — R0789 Other chest pain: Secondary | ICD-10-CM | POA: Diagnosis not present

## 2017-04-22 MED ORDER — BENZONATATE 100 MG PO CAPS: 100.0000 mg | ORAL_CAPSULE | Freq: Three times a day (TID) | ORAL | 0 refills | Status: DC | PRN

## 2017-04-22 NOTE — ED Triage Notes (Signed)
Cough, chest pain, SOB x 1 week. Cough noted in triage.  BS clear bilaterally.

## 2017-04-22 NOTE — ED Provider Notes (Signed)
[facility] EMERGENCY DEPARTMENT Provider Note   CSN: [id number] Arrival date & time: 04/22/17  2353     History   Chief Complaint Chief Complaint  Patient presents with  . Cough    HPI Gina Nichols is a 47 y.o. female with past medical history of brain neoplasm, presenting to the ED with 1 week of upper respiratory symptoms.  Patient states symptoms began with a sore throat and progressed to nasal congestion with a dry cough.  She reports she feels chest tightness and mildly short of breath with her cough.  Has been taking Alka-Seltzer cold and flu for her symptoms.  Denies fever, difficulty swallowing, ear pain, travel or surgery, exogenous estrogen, lower extremity edema or pain, or history of blood clot. Has not had influenza vaccine this year. The history is provided by the patient.    Past Medical History:  Diagnosis Date  . Anginal pain ([place])    Chest pressure when bending over most recent Oct. 31, 2015  . Depression    history of  . Diabetes mellitus without complication ([place])    pre-diabetic  . Dysrhythmia    SVT s/p ablation 2015  . Headache    s/p brain tumor 11/1999  . Left leg numbness    s/p back surgery  . Shortness of breath dyspnea    with stairs, wakes up out of sleep, occassional wheezes noted by patient    Patient Active Problem List   Diagnosis Date Noted  . Left knee pain 01/11/2017  . Right ovarian cyst 03/03/2014  . Neoplasm of brain ([place]) 08/18/2012    Past Surgical History:  Procedure Laterality Date  . ABLATION    . APPENDECTOMY    . BACK SURGERY    . brain tumor    . BREAST REDUCTION SURGERY    . COLONOSCOPY    . LAPAROSCOPY N/A 03/03/2014   Procedure: LAPAROSCOPY OPERATIVE;  Surgeon: Cheri Fowler, MD;  Location: [facility];  Service: Gynecology;  Laterality: N/A;  Physician only need 1hr OR time  . SALPINGOOPHORECTOMY Right 03/03/2014   Procedure: SALPINGO OOPHORECTOMY;  Surgeon: Cheri Fowler, MD;  Location: [facility];   Service: Gynecology;  Laterality: Right;  . WISDOM TOOTH EXTRACTION      OB History    No data available       Home Medications    Prior to Admission medications   Medication Sig Start Date End Date Taking? Authorizing Provider  benzonatate (TESSALON) 100 MG capsule Take 1 capsule (100 mg total) by mouth 3 (three) times daily as needed for cough. 04/22/17   Robinson, [place] N, PA-C  cholecalciferol (VITAMIN D) 1000 units tablet Take 1,000 Units by mouth daily.    [provider]  naproxen (NAPROSYN) 500 MG tablet Take 1 tablet (500 mg total) by mouth 2 (two) times daily. 01/10/17   Merryl Hacker, MD  NP THYROID 15 MG tablet TK 1 T PO QD OES 12/20/16   [provider]    Family History History reviewed. No pertinent family history.  Social History Social History   Tobacco Use  . Smoking status: Never Smoker  . Smokeless tobacco: Never Used  Substance Use Topics  . Alcohol use: No  . Drug use: No     Allergies   Patient has no known allergies.   Review of Systems Review of Systems  Constitutional: Negative for fever.  HENT: Positive for congestion and sore throat. Negative for ear pain, trouble swallowing and voice change.  Respiratory: Positive for cough, chest tightness and shortness of breath.   Cardiovascular: Negative for chest pain and leg swelling.  All other systems reviewed and are negative.    Physical Exam Updated Vital Signs BP (!) 173/101 (BP Location: Left Arm)   Pulse 68   Temp 98.2 F (36.8 C) (Oral)   Resp 18   Ht 5\' 5"  (1.651 m)   Wt 108.9 kg (240 lb)   SpO2 97%   BMI 39.94 kg/m   Physical Exam  Constitutional: She appears well-developed and well-nourished. No distress.  Well- Appearing, tolerating secretions.  No increased work of breathing.  HENT:  Head: Normocephalic and atraumatic.  Right Ear: Tympanic membrane, external ear and ear canal normal.  Left Ear: Tympanic membrane, external ear and ear canal  normal.  Mouth/Throat: Uvula is midline and oropharynx is clear and moist. No trismus in the jaw. No uvula swelling.  Nose is congested  Eyes: Conjunctivae are normal.  Neck: Normal range of motion. Neck supple.  Cardiovascular: Normal rate, regular rhythm, normal heart sounds and intact distal pulses.  Pulmonary/Chest: Effort normal and breath sounds normal. No stridor. No respiratory distress. She has no wheezes. She has no rales.  Musculoskeletal:  No lower extremity edema or tenderness.  Lymphadenopathy:    She has no cervical adenopathy.  Psychiatric: She has a normal mood and affect. Her behavior is normal.  Nursing note and vitals reviewed.    ED Treatments / Results  Labs (all labs ordered are listed, but only abnormal results are displayed) Labs Reviewed - No data to display  EKG  EKG Interpretation None       Radiology Dg Chest 2 View  Result Date: 04/22/2017 CLINICAL DATA:  Shortness of breath with cough and chest pain EXAM: CHEST  2 VIEW COMPARISON:  May 04, 2013 FINDINGS: Lungs are clear. Heart size and pulmonary vascularity are normal. No adenopathy. No bone lesions. No pneumothorax. IMPRESSION: No edema or consolidation. Electronically Signed   By: Lowella Grip III M.D.   On: 04/22/2017 19:27    Procedures Procedures (including critical care time)  Medications Ordered in ED Medications - No data to display   Initial Impression / Assessment and Plan / ED Course  I have reviewed the triage vital signs and the nursing notes.  Pertinent labs & imaging results that were available during my care of the patient were reviewed by me and considered in my medical decision making (see chart for details).     Patients symptoms are consistent with URI, likely viral etiology. Afebrile, tolerating secretions, no inc work of breathing.  Lungs clear to auscultation bilaterally. CXR negative for acute infiltrate. PERC neg, no risk factors for CAD; chest tightness  and SOB likely 2/t URI.  Discussed that antibiotics are not indicated for viral infections. Also recommend pt follow up with PCP regarding BP, she attributes to "white coat HTN," though still recommend follow up for recheck. Pt will be discharged with symptomatic treatment.  Verbalizes understanding and is agreeable with plan. Pt is hemodynamically stable & in NAD prior to dc.  Discussed results, findings, treatment and follow up. Patient advised of return precautions. Patient verbalized understanding and agreed with plan.  Final Clinical Impressions(s) / ED Diagnoses   Final diagnoses:  Viral URI with cough    ED Discharge Orders        Ordered    benzonatate (TESSALON) 100 MG capsule  3 times daily PRN     04/22/17 2035  Robinson, [place] N, PA-C 04/22/17 2036    Macarthur Critchley, MD 04/22/17 2357

## 2017-04-22 NOTE — Discharge Instructions (Signed)
Please read instructions below. You can take tylenol as needed for sore throat or body aches. Drink plenty of water. You can take tessalon every 8 hours for cough. Follow up with your primary care provider for blood pressure recheck. Return to the ER for difficulty swallowing liquids, difficulty breathing, or new or worsening symptoms.

## 2017-05-19 DIAGNOSIS — J40 Bronchitis, not specified as acute or chronic: Secondary | ICD-10-CM | POA: Diagnosis not present

## 2017-05-22 DIAGNOSIS — Z01419 Encounter for gynecological examination (general) (routine) without abnormal findings: Secondary | ICD-10-CM | POA: Diagnosis not present

## 2017-05-22 DIAGNOSIS — Z113 Encounter for screening for infections with a predominantly sexual mode of transmission: Secondary | ICD-10-CM | POA: Diagnosis not present

## 2017-05-22 DIAGNOSIS — Z1231 Encounter for screening mammogram for malignant neoplasm of breast: Secondary | ICD-10-CM | POA: Diagnosis not present

## 2017-05-24 DIAGNOSIS — K219 Gastro-esophageal reflux disease without esophagitis: Secondary | ICD-10-CM | POA: Diagnosis not present

## 2017-06-12 DIAGNOSIS — Z713 Dietary counseling and surveillance: Secondary | ICD-10-CM | POA: Diagnosis not present

## 2017-06-12 DIAGNOSIS — R062 Wheezing: Secondary | ICD-10-CM | POA: Diagnosis not present

## 2017-06-12 DIAGNOSIS — Z85841 Personal history of malignant neoplasm of brain: Secondary | ICD-10-CM | POA: Diagnosis not present

## 2017-06-12 DIAGNOSIS — G894 Chronic pain syndrome: Secondary | ICD-10-CM | POA: Diagnosis not present

## 2017-06-12 DIAGNOSIS — R030 Elevated blood-pressure reading, without diagnosis of hypertension: Secondary | ICD-10-CM | POA: Diagnosis not present

## 2017-06-13 DIAGNOSIS — G894 Chronic pain syndrome: Secondary | ICD-10-CM | POA: Diagnosis not present

## 2017-06-13 DIAGNOSIS — D496 Neoplasm of unspecified behavior of brain: Secondary | ICD-10-CM | POA: Diagnosis not present

## 2017-06-13 DIAGNOSIS — R062 Wheezing: Secondary | ICD-10-CM | POA: Diagnosis not present

## 2017-06-13 DIAGNOSIS — Z713 Dietary counseling and surveillance: Secondary | ICD-10-CM | POA: Diagnosis not present

## 2017-06-13 DIAGNOSIS — R030 Elevated blood-pressure reading, without diagnosis of hypertension: Secondary | ICD-10-CM | POA: Diagnosis not present

## 2017-06-13 DIAGNOSIS — Z0000 Encounter for general adult medical examination without abnormal findings: Secondary | ICD-10-CM | POA: Diagnosis not present

## 2017-06-13 DIAGNOSIS — Z131 Encounter for screening for diabetes mellitus: Secondary | ICD-10-CM | POA: Diagnosis not present

## 2017-06-26 DIAGNOSIS — H40021 Open angle with borderline findings, high risk, right eye: Secondary | ICD-10-CM | POA: Diagnosis not present

## 2017-06-26 DIAGNOSIS — H04123 Dry eye syndrome of bilateral lacrimal glands: Secondary | ICD-10-CM | POA: Diagnosis not present

## 2017-06-26 DIAGNOSIS — H53452 Other localized visual field defect, left eye: Secondary | ICD-10-CM | POA: Diagnosis not present

## 2017-06-26 DIAGNOSIS — H52223 Regular astigmatism, bilateral: Secondary | ICD-10-CM | POA: Diagnosis not present

## 2017-06-26 DIAGNOSIS — H5213 Myopia, bilateral: Secondary | ICD-10-CM | POA: Diagnosis not present

## 2017-06-26 DIAGNOSIS — H40029 Open angle with borderline findings, high risk, unspecified eye: Secondary | ICD-10-CM | POA: Diagnosis not present

## 2017-07-03 DIAGNOSIS — F4323 Adjustment disorder with mixed anxiety and depressed mood: Secondary | ICD-10-CM | POA: Diagnosis not present

## 2017-07-10 DIAGNOSIS — R062 Wheezing: Secondary | ICD-10-CM | POA: Diagnosis not present

## 2017-07-25 DIAGNOSIS — Z6841 Body Mass Index (BMI) 40.0 and over, adult: Secondary | ICD-10-CM | POA: Diagnosis not present

## 2017-07-25 DIAGNOSIS — R05 Cough: Secondary | ICD-10-CM | POA: Diagnosis not present

## 2017-07-25 DIAGNOSIS — R5383 Other fatigue: Secondary | ICD-10-CM | POA: Diagnosis not present

## 2017-07-25 DIAGNOSIS — J984 Other disorders of lung: Secondary | ICD-10-CM | POA: Diagnosis not present

## 2017-07-25 DIAGNOSIS — R030 Elevated blood-pressure reading, without diagnosis of hypertension: Secondary | ICD-10-CM | POA: Diagnosis not present

## 2017-07-25 DIAGNOSIS — R7303 Prediabetes: Secondary | ICD-10-CM | POA: Diagnosis not present

## 2017-08-28 DIAGNOSIS — G8929 Other chronic pain: Secondary | ICD-10-CM | POA: Diagnosis not present

## 2017-08-28 DIAGNOSIS — R030 Elevated blood-pressure reading, without diagnosis of hypertension: Secondary | ICD-10-CM | POA: Diagnosis not present

## 2017-08-28 DIAGNOSIS — E538 Deficiency of other specified B group vitamins: Secondary | ICD-10-CM | POA: Diagnosis not present

## 2017-08-28 DIAGNOSIS — M545 Low back pain: Secondary | ICD-10-CM | POA: Diagnosis not present

## 2017-08-28 DIAGNOSIS — M961 Postlaminectomy syndrome, not elsewhere classified: Secondary | ICD-10-CM | POA: Diagnosis not present

## 2017-12-14 IMAGING — CR DG WRIST COMPLETE 3+V*L*
4 series · 4 of 4 positions shown · non-contrast
Comparison: None.

CLINICAL DATA: Left wrist pain for several days

EXAM:
LEFT WRIST - COMPLETE 3+ VIEW

[x wrist pa left]
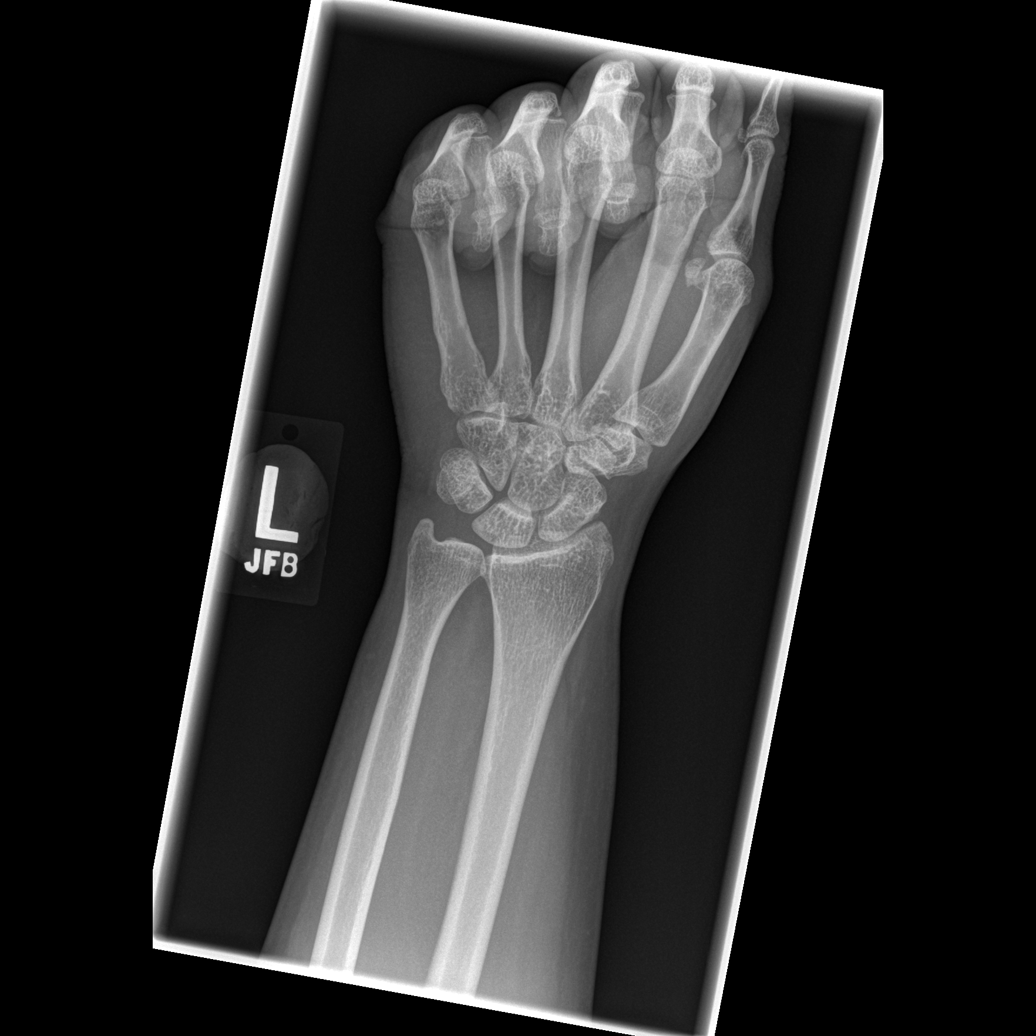

[x wrist obl left]
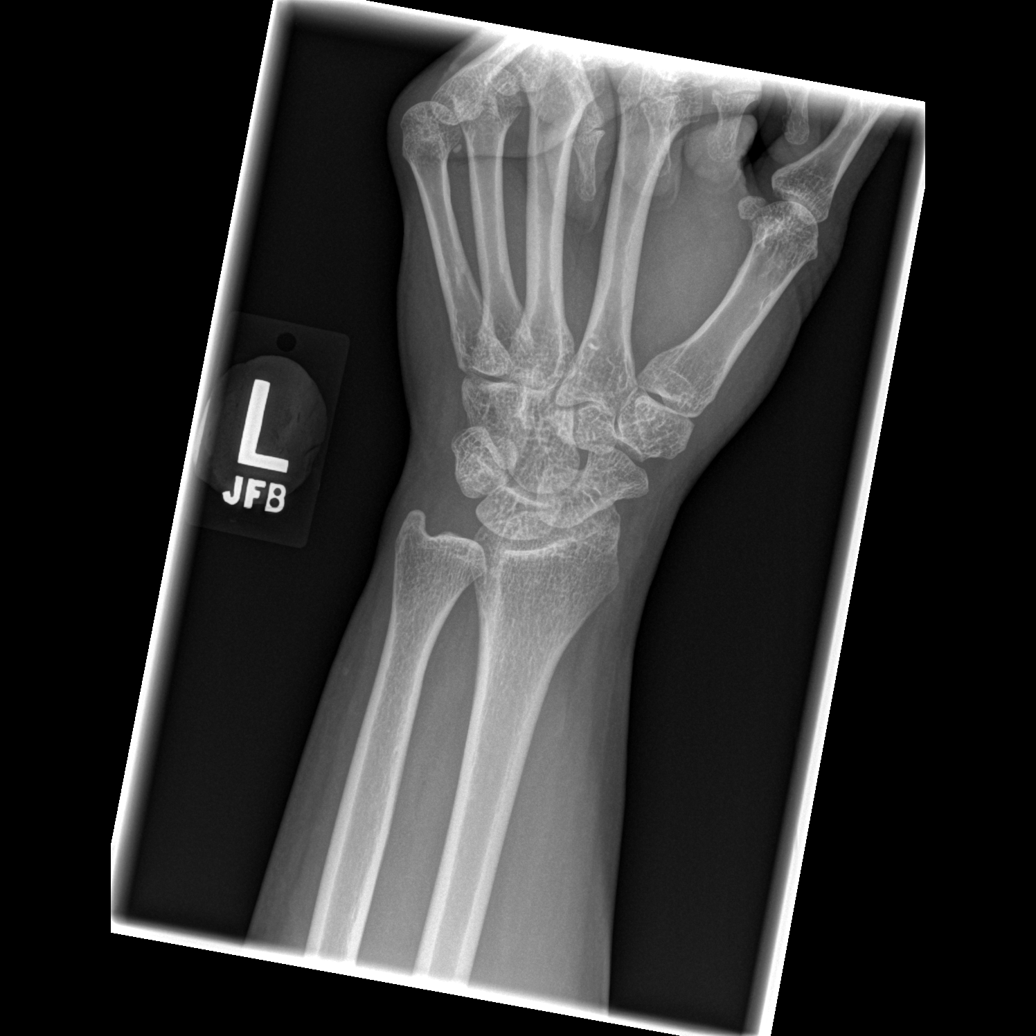

[x wrist lat left]
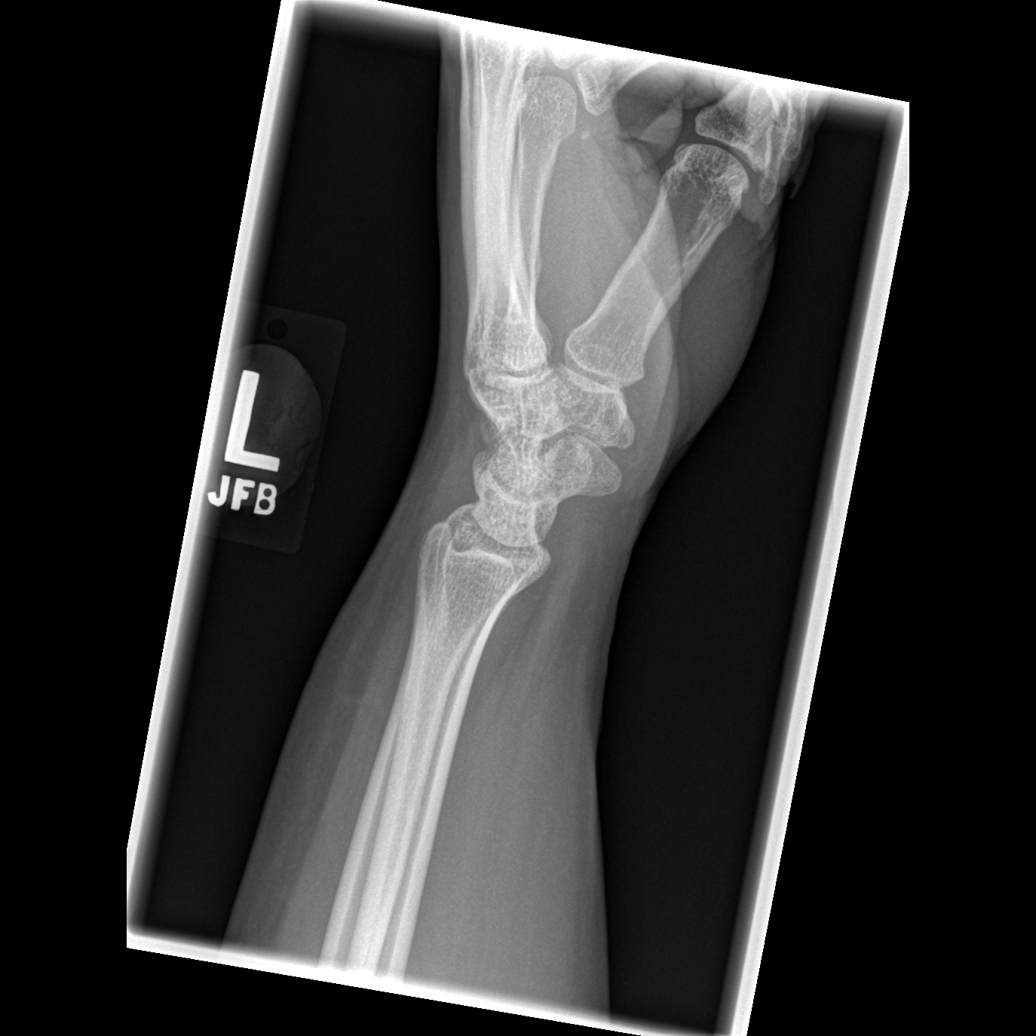

[x navicular]
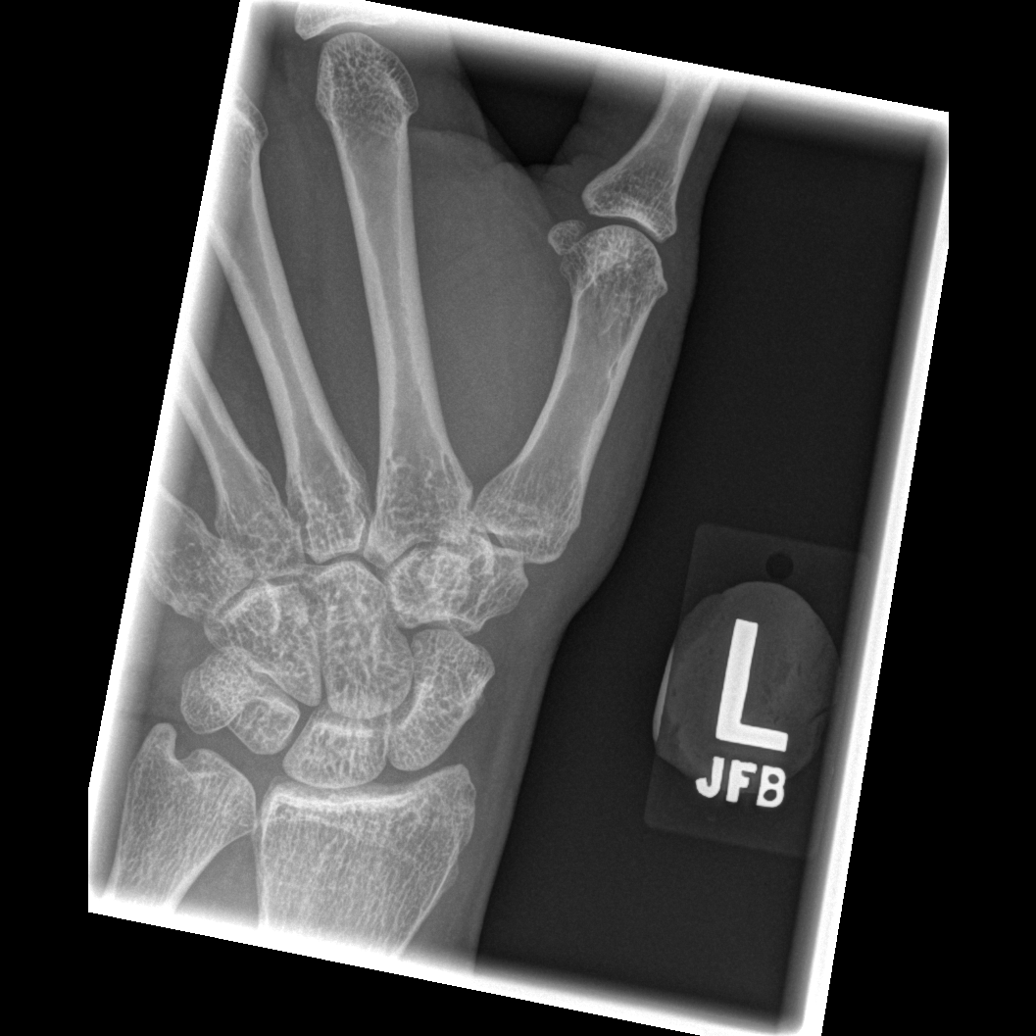

[4 of 4 positions shown; findings below may reference images not displayed]

FINDINGS: Four views of the left wrist submitted. No acute fracture or
subluxation. No radiopaque foreign body.
IMPRESSION: Negative.

## 2019-10-25 IMAGING — DX DG CHEST 2V
2 series · 2 of 2 positions shown · non-contrast
Comparison: May 04, 2013

CLINICAL DATA: Shortness of breath with cough and chest pain

EXAM:
CHEST  2 VIEW

[chest pa]
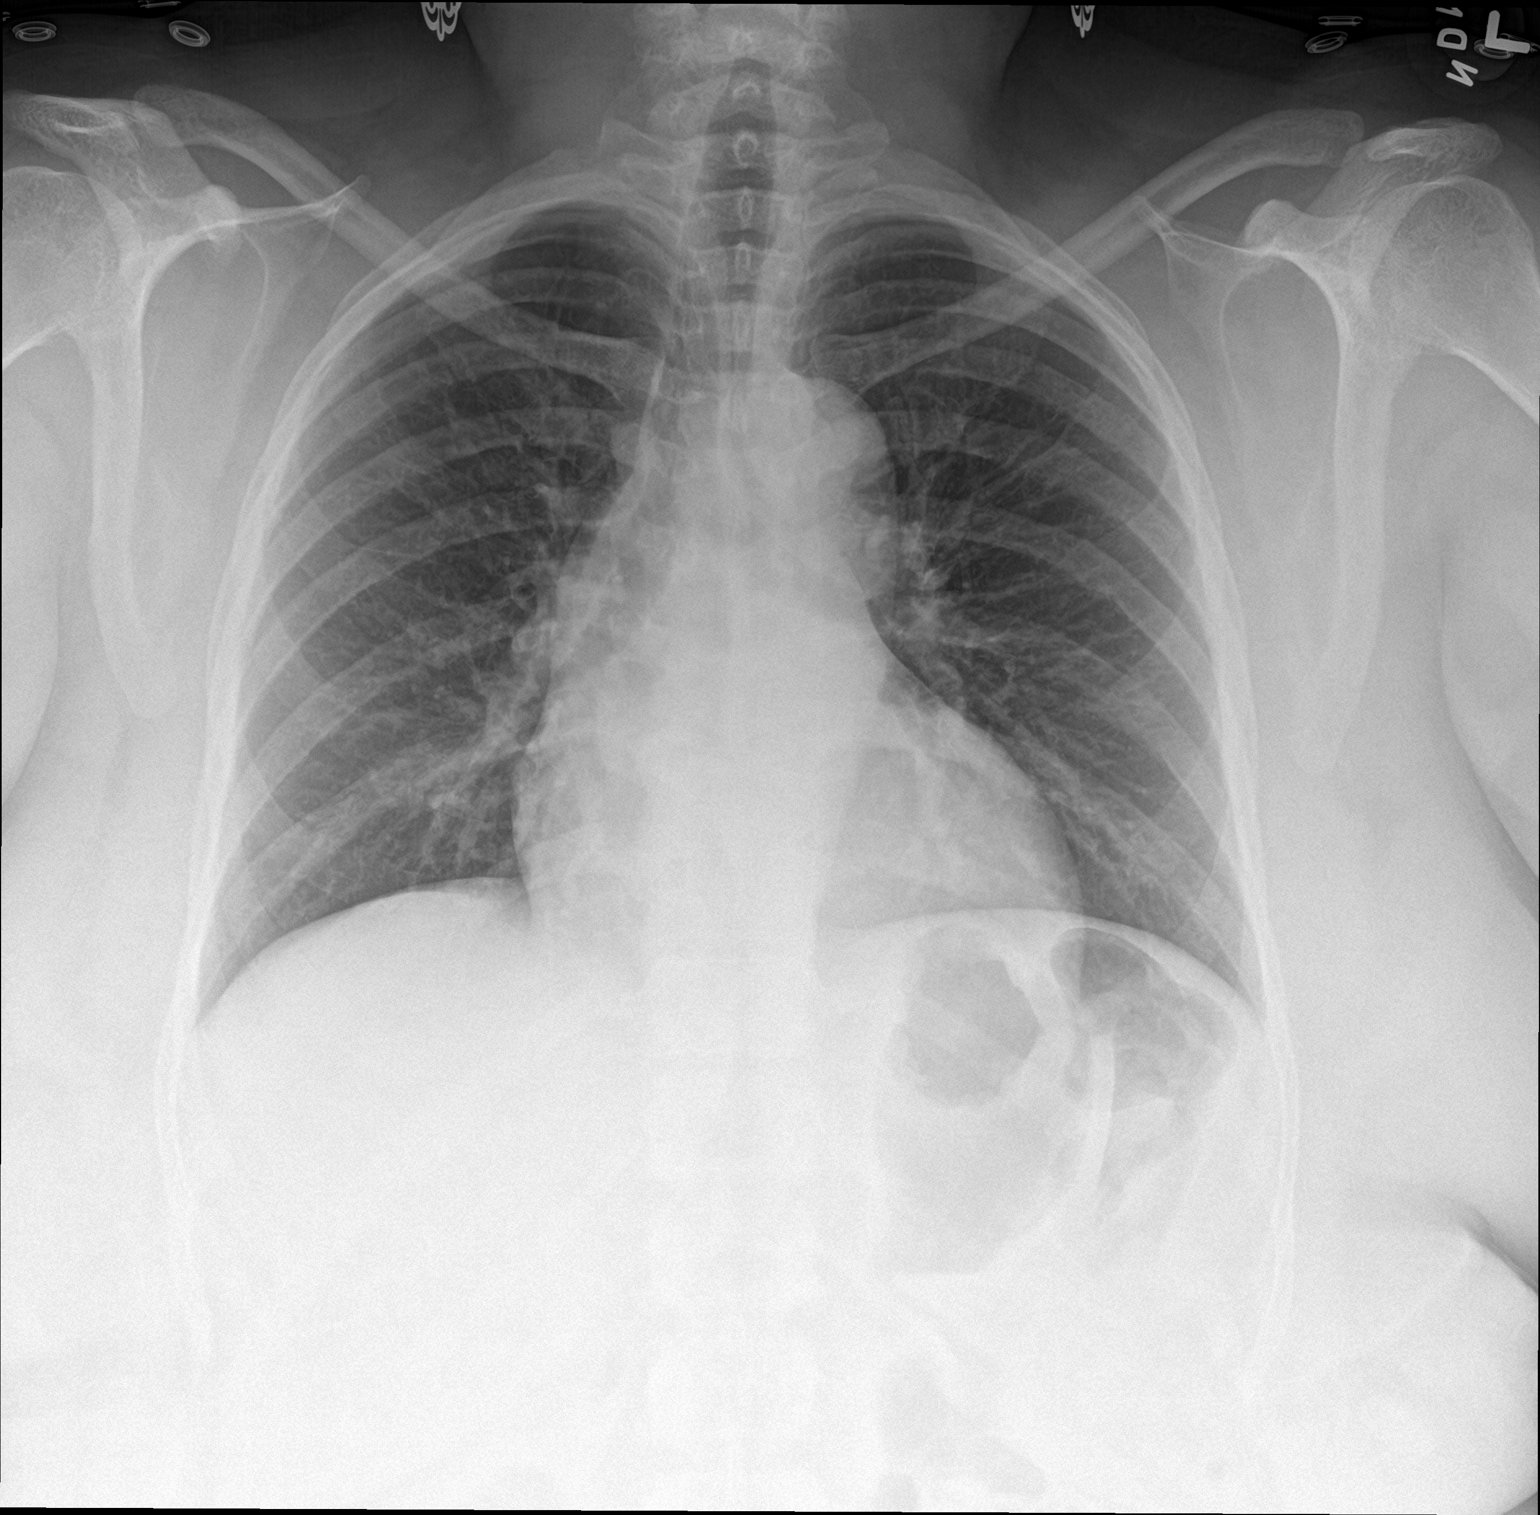

[chest lat]
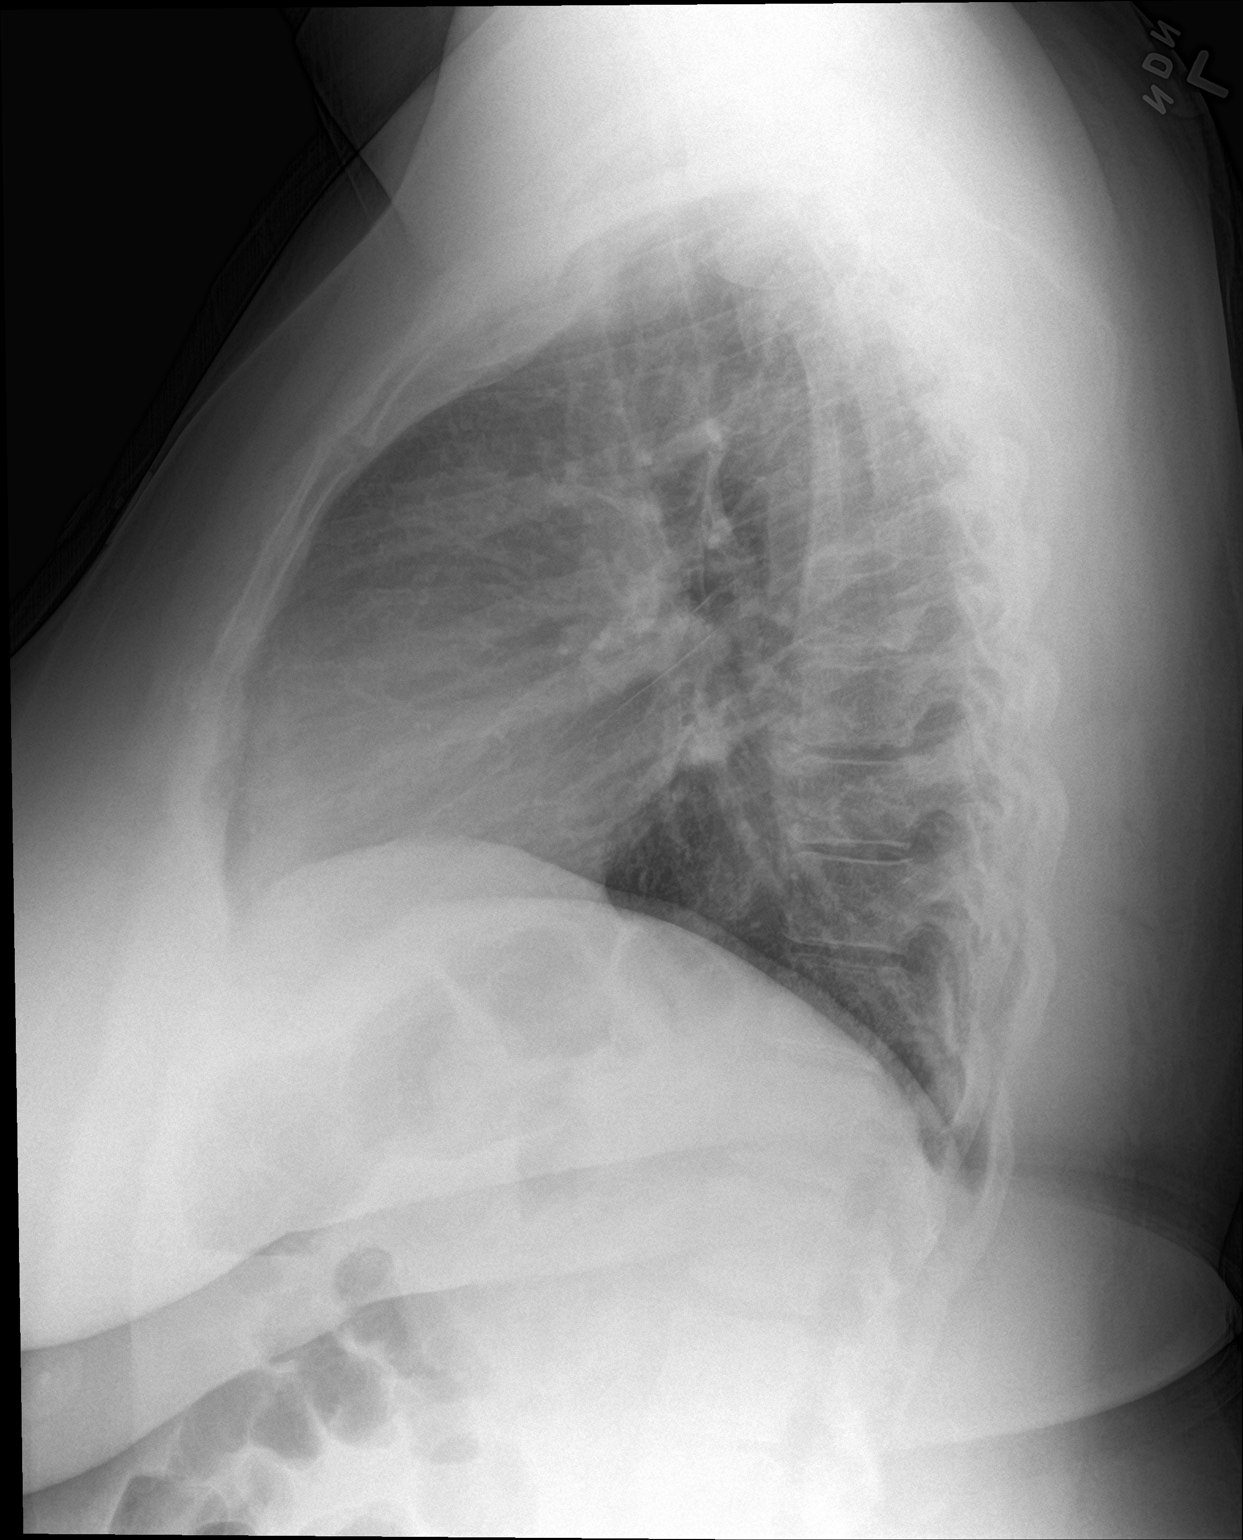

[2 of 2 positions shown; findings below may reference images not displayed]

FINDINGS: Lungs are clear. Heart size and pulmonary vascularity are normal. No
adenopathy. No bone lesions. No pneumothorax.
IMPRESSION: No edema or consolidation.

## 2021-08-30 ENCOUNTER — Inpatient Hospital Stay (HOSPITAL_COMMUNITY): Payer: Medicare HMO

## 2021-08-30 ENCOUNTER — Emergency Department (HOSPITAL_COMMUNITY): Payer: Medicare HMO

## 2021-08-30 ENCOUNTER — Encounter (HOSPITAL_COMMUNITY)
Admission: EM | Disposition: A | Discharge status: Home / Self Care | Payer: Self-pay | Source: Home / Self Care | Attending: Neurology

## 2021-08-30 ENCOUNTER — Other Ambulatory Visit: Payer: Self-pay

## 2021-08-30 ENCOUNTER — Encounter (HOSPITAL_COMMUNITY): Payer: Self-pay | Admitting: Neurology

## 2021-08-30 ENCOUNTER — Emergency Department (HOSPITAL_COMMUNITY): Payer: Medicare HMO | Admitting: Registered Nurse

## 2021-08-30 ENCOUNTER — Inpatient Hospital Stay (HOSPITAL_COMMUNITY)
Admission: EM | Admit: 2021-08-30 | Discharge: 2021-09-06 | DRG: 023 | Disposition: A | Discharge status: Home / Self Care | Payer: Medicare HMO | Attending: Neurology | Admitting: Neurology

## 2021-08-30 DIAGNOSIS — I63412 Cerebral infarction due to embolism of left middle cerebral artery: Principal | ICD-10-CM

## 2021-08-30 DIAGNOSIS — G8191 Hemiplegia, unspecified affecting right dominant side: Secondary | ICD-10-CM | POA: Diagnosis present

## 2021-08-30 DIAGNOSIS — E876 Hypokalemia: Secondary | ICD-10-CM

## 2021-08-30 DIAGNOSIS — R29703 NIHSS score 3: Secondary | ICD-10-CM | POA: Diagnosis not present

## 2021-08-30 DIAGNOSIS — R29810 Facial weakness: Secondary | ICD-10-CM | POA: Diagnosis present

## 2021-08-30 DIAGNOSIS — R29720 NIHSS score 20: Secondary | ICD-10-CM | POA: Diagnosis present

## 2021-08-30 DIAGNOSIS — F17290 Nicotine dependence, other tobacco product, uncomplicated: Secondary | ICD-10-CM | POA: Diagnosis present

## 2021-08-30 DIAGNOSIS — I340 Nonrheumatic mitral (valve) insufficiency: Secondary | ICD-10-CM | POA: Diagnosis not present

## 2021-08-30 DIAGNOSIS — I639 Cerebral infarction, unspecified: Secondary | ICD-10-CM | POA: Diagnosis not present

## 2021-08-30 DIAGNOSIS — R4701 Aphasia: Secondary | ICD-10-CM | POA: Diagnosis present

## 2021-08-30 DIAGNOSIS — Z87898 Personal history of other specified conditions: Secondary | ICD-10-CM | POA: Diagnosis not present

## 2021-08-30 DIAGNOSIS — Z8249 Family history of ischemic heart disease and other diseases of the circulatory system: Secondary | ICD-10-CM

## 2021-08-30 DIAGNOSIS — I10 Essential (primary) hypertension: Secondary | ICD-10-CM | POA: Diagnosis not present

## 2021-08-30 DIAGNOSIS — N289 Disorder of kidney and ureter, unspecified: Secondary | ICD-10-CM

## 2021-08-30 DIAGNOSIS — I63512 Cerebral infarction due to unspecified occlusion or stenosis of left middle cerebral artery: Secondary | ICD-10-CM

## 2021-08-30 DIAGNOSIS — E785 Hyperlipidemia, unspecified: Secondary | ICD-10-CM | POA: Diagnosis present

## 2021-08-30 DIAGNOSIS — Z8673 Personal history of transient ischemic attack (TIA), and cerebral infarction without residual deficits: Secondary | ICD-10-CM | POA: Diagnosis not present

## 2021-08-30 DIAGNOSIS — Z820 Family history of epilepsy and other diseases of the nervous system: Secondary | ICD-10-CM | POA: Diagnosis not present

## 2021-08-30 DIAGNOSIS — I5021 Acute systolic (congestive) heart failure: Secondary | ICD-10-CM | POA: Diagnosis present

## 2021-08-30 DIAGNOSIS — Z86011 Personal history of benign neoplasm of the brain: Secondary | ICD-10-CM | POA: Diagnosis not present

## 2021-08-30 DIAGNOSIS — I428 Other cardiomyopathies: Secondary | ICD-10-CM | POA: Diagnosis present

## 2021-08-30 DIAGNOSIS — I6389 Other cerebral infarction: Secondary | ICD-10-CM | POA: Diagnosis not present

## 2021-08-30 DIAGNOSIS — R030 Elevated blood-pressure reading, without diagnosis of hypertension: Secondary | ICD-10-CM

## 2021-08-30 DIAGNOSIS — I110 Hypertensive heart disease with heart failure: Secondary | ICD-10-CM | POA: Diagnosis present

## 2021-08-30 DIAGNOSIS — Z79899 Other long term (current) drug therapy: Secondary | ICD-10-CM | POA: Diagnosis not present

## 2021-08-30 DIAGNOSIS — Z9884 Bariatric surgery status: Secondary | ICD-10-CM

## 2021-08-30 DIAGNOSIS — R0602 Shortness of breath: Secondary | ICD-10-CM | POA: Diagnosis not present

## 2021-08-30 DIAGNOSIS — I420 Dilated cardiomyopathy: Secondary | ICD-10-CM | POA: Diagnosis not present

## 2021-08-30 DIAGNOSIS — I429 Cardiomyopathy, unspecified: Secondary | ICD-10-CM | POA: Diagnosis not present

## 2021-08-30 DIAGNOSIS — Z833 Family history of diabetes mellitus: Secondary | ICD-10-CM

## 2021-08-30 DIAGNOSIS — I361 Nonrheumatic tricuspid (valve) insufficiency: Secondary | ICD-10-CM | POA: Diagnosis not present

## 2021-08-30 DIAGNOSIS — I255 Ischemic cardiomyopathy: Secondary | ICD-10-CM | POA: Diagnosis not present

## 2021-08-30 DIAGNOSIS — I088 Other rheumatic multiple valve diseases: Secondary | ICD-10-CM | POA: Diagnosis not present

## 2021-08-30 HISTORY — PX: RADIOLOGY WITH ANESTHESIA: SHX6223

## 2021-08-30 HISTORY — PX: IR PERCUTANEOUS ART THROMBECTOMY/INFUSION INTRACRANIAL INC DIAG ANGIO: IMG6087

## 2021-08-30 HISTORY — PX: IR CT HEAD LTD: IMG2386

## 2021-08-30 HISTORY — PX: IR US GUIDE VASC ACCESS RIGHT: IMG2390

## 2021-08-30 LAB — I-STAT BETA HCG BLOOD, ED (MC, WL, AP ONLY): I-stat hCG, quantitative: <5 m[IU]/mL (ref <5)

## 2021-08-30 LAB — PROTIME-INR
INR: 1.1 (ref 0.8–1.2)
Prothrombin Time: 14 seconds (ref 11.4–15.2)

## 2021-08-30 LAB — LIPID PANEL
Cholesterol: 190 mg/dL (ref 0–200)
HDL: 59 mg/dL (ref >40)
LDL Cholesterol: 123 mg/dL — ABNORMAL HIGH (ref 0–99)
Total CHOL/HDL Ratio: 3.2 RATIO
Triglycerides: 41 mg/dL (ref <150)
VLDL: 8 mg/dL (ref 0–40)

## 2021-08-30 LAB — I-STAT CHEM 8, ED
BUN: 21 mg/dL — ABNORMAL HIGH (ref 6–20)
Calcium, Ion: 1.09 mmol/L — ABNORMAL LOW (ref 1.15–1.40)
Chloride: 104 mmol/L (ref 98–111)
Creatinine, Ser: 1.3 mg/dL — ABNORMAL HIGH (ref 0.44–1.00)
Glucose, Bld: 131 mg/dL — ABNORMAL HIGH (ref 70–99)
HCT: 38 % (ref 36.0–46.0)
Hemoglobin: 12.9 g/dL (ref 12.0–15.0)
Potassium: 2.8 mmol/L — ABNORMAL LOW (ref 3.5–5.1)
Sodium: 143 mmol/L (ref 135–145)
TCO2: 26 mmol/L (ref 22–32)

## 2021-08-30 LAB — COMPREHENSIVE METABOLIC PANEL
ALT: 77 U/L — ABNORMAL HIGH (ref 0–44)
AST: 24 U/L (ref 15–41)
Albumin: 3 g/dL — ABNORMAL LOW (ref 3.5–5.0)
Alkaline Phosphatase: 74 U/L (ref 38–126)
Anion gap: 10 (ref 5–15)
BUN: 22 mg/dL — ABNORMAL HIGH (ref 6–20)
CO2: 24 mmol/L (ref 22–32)
Calcium: 8.4 mg/dL — ABNORMAL LOW (ref 8.9–10.3)
Chloride: 107 mmol/L (ref 98–111)
Creatinine, Ser: 1.27 mg/dL — ABNORMAL HIGH (ref 0.44–1.00)
GFR, Estimated: 51 mL/min — ABNORMAL LOW (ref >60)
Glucose, Bld: 131 mg/dL — ABNORMAL HIGH (ref 70–99)
Potassium: 2.9 mmol/L — ABNORMAL LOW (ref 3.5–5.1)
Sodium: 141 mmol/L (ref 135–145)
Total Bilirubin: 0.5 mg/dL (ref 0.3–1.2)
Total Protein: 5.3 g/dL — ABNORMAL LOW (ref 6.5–8.1)

## 2021-08-30 LAB — DIFFERENTIAL
Abs Immature Granulocytes: 0.01 10*3/uL (ref 0.00–0.07)
Basophils Absolute: 0 10*3/uL (ref 0.0–0.1)
Basophils Relative: 0 %
Eosinophils Absolute: 0.1 10*3/uL (ref 0.0–0.5)
Eosinophils Relative: 1 %
Immature Granulocytes: 0 %
Lymphocytes Relative: 30 %
Lymphs Abs: 1.6 10*3/uL (ref 0.7–4.0)
Monocytes Absolute: 0.4 10*3/uL (ref 0.1–1.0)
Monocytes Relative: 8 %
Neutro Abs: 3.2 10*3/uL (ref 1.7–7.7)
Neutrophils Relative %: 61 %

## 2021-08-30 LAB — ECHOCARDIOGRAM COMPLETE
AR max vel: 3.78 cm2
AV Peak grad: 5.2 mmHg
Ao pk vel: 1.15 m/s
Area-P 1/2: 6.96 cm2
S' Lateral: 4.9 cm
Weight: 2962.98 oz

## 2021-08-30 LAB — CBG MONITORING, ED: Glucose-Capillary: 121 mg/dL — ABNORMAL HIGH (ref 70–99)

## 2021-08-30 LAB — CBC
HCT: 37.6 % (ref 36.0–46.0)
Hemoglobin: 12.7 g/dL (ref 12.0–15.0)
MCH: 33.2 pg (ref 26.0–34.0)
MCHC: 33.8 g/dL (ref 30.0–36.0)
MCV: 98.4 fL (ref 80.0–100.0)
Platelets: 150 10*3/uL (ref 150–400)
RBC: 3.82 MIL/uL — ABNORMAL LOW (ref 3.87–5.11)
RDW: 16 % — ABNORMAL HIGH (ref 11.5–15.5)
WBC: 5.3 10*3/uL (ref 4.0–10.5)
nRBC: 0 % (ref 0.0–0.2)

## 2021-08-30 LAB — HEMOGLOBIN A1C
Hgb A1c MFr Bld: 5.3 % (ref 4.8–5.6)
Mean Plasma Glucose: 105.41 mg/dL

## 2021-08-30 LAB — APTT: aPTT: 22 seconds — ABNORMAL LOW (ref 24–36)

## 2021-08-30 LAB — GLUCOSE, CAPILLARY: Glucose-Capillary: 115 mg/dL — ABNORMAL HIGH (ref 70–99)

## 2021-08-30 LAB — MRSA NEXT GEN BY PCR, NASAL: MRSA by PCR Next Gen: NOT DETECTED

## 2021-08-30 LAB — HIV ANTIBODY (ROUTINE TESTING W REFLEX): HIV Screen 4th Generation wRfx: NONREACTIVE

## 2021-08-30 LAB — ETHANOL: Alcohol, Ethyl (B): <10 mg/dL (ref <10)

## 2021-08-30 LAB — MAGNESIUM: Magnesium: 2 mg/dL (ref 1.7–2.4)

## 2021-08-30 SURGERY — IR WITH ANESTHESIA
Anesthesia: General | Laterality: Right

## 2021-08-30 MED ORDER — ONDANSETRON HCL 4 MG/2ML IJ SOLN
4.0000 mg | Freq: Four times a day (QID) | INTRAMUSCULAR | Status: DC | PRN
Start: 2021-08-30 — End: 2021-09-06

## 2021-08-30 MED ORDER — FENTANYL CITRATE (PF) 100 MCG/2ML IJ SOLN
INTRAMUSCULAR | Status: AC
  Filled 2021-08-30: qty 2

## 2021-08-30 MED ORDER — PHENYLEPHRINE HCL-NACL 20-0.9 MG/250ML-% IV SOLN
INTRAVENOUS | Status: DC | PRN
  Administered 2021-08-30: 30 ug/min via INTRAVENOUS

## 2021-08-30 MED ORDER — TICAGRELOR 90 MG PO TABS
ORAL_TABLET | ORAL | Status: AC
  Filled 2021-08-30: qty 2

## 2021-08-30 MED ORDER — ATORVASTATIN CALCIUM 40 MG PO TABS
40.0000 mg | ORAL_TABLET | Freq: Every day | ORAL | Status: DC
  Administered 2021-08-30 – 2021-09-06 (×8): 40 mg via ORAL
  Filled 2021-08-30 (×8): qty 1

## 2021-08-30 MED ORDER — PHENYLEPHRINE 80 MCG/ML (10ML) SYRINGE FOR IV PUSH (FOR BLOOD PRESSURE SUPPORT)
PREFILLED_SYRINGE | INTRAVENOUS | Status: DC | PRN
  Administered 2021-08-30 (×4): 80 ug via INTRAVENOUS

## 2021-08-30 MED ORDER — VERAPAMIL HCL 2.5 MG/ML IV SOLN
INTRAVENOUS | Status: AC
  Filled 2021-08-30: qty 2

## 2021-08-30 MED ORDER — ASPIRIN 81 MG PO CHEW
CHEWABLE_TABLET | ORAL | Status: AC
  Filled 2021-08-30: qty 1

## 2021-08-30 MED ORDER — ACETAMINOPHEN 325 MG PO TABS
650.0000 mg | ORAL_TABLET | ORAL | Status: DC | PRN
  Administered 2021-08-31 – 2021-09-05 (×5): 650 mg via ORAL
  Filled 2021-08-30 (×6): qty 2

## 2021-08-30 MED ORDER — ACETAMINOPHEN 650 MG RE SUPP: 650.0000 mg | RECTAL | Status: DC | PRN

## 2021-08-30 MED ORDER — SENNOSIDES-DOCUSATE SODIUM 8.6-50 MG PO TABS: 1.0000 | ORAL_TABLET | Freq: Every evening | ORAL | Status: DC | PRN

## 2021-08-30 MED ORDER — ACETAMINOPHEN 160 MG/5ML PO SOLN: 650.0000 mg | ORAL | Status: DC | PRN

## 2021-08-30 MED ORDER — CANGRELOR TETRASODIUM 50 MG IV SOLR
INTRAVENOUS | Status: AC
  Filled 2021-08-30: qty 50

## 2021-08-30 MED ORDER — LIDOCAINE 2% (20 MG/ML) 5 ML SYRINGE
INTRAMUSCULAR | Status: DC | PRN
  Administered 2021-08-30: 60 mg via INTRAVENOUS

## 2021-08-30 MED ORDER — IOHEXOL 350 MG/ML SOLN
75.0000 mL | Freq: Once | INTRAVENOUS | Status: AC | PRN
  Administered 2021-08-30: 75 mL via INTRAVENOUS

## 2021-08-30 MED ORDER — PROPOFOL 10 MG/ML IV BOLUS
INTRAVENOUS | Status: DC | PRN
  Administered 2021-08-30: 130 mg via INTRAVENOUS

## 2021-08-30 MED ORDER — FENTANYL CITRATE (PF) 250 MCG/5ML IJ SOLN
INTRAMUSCULAR | Status: DC | PRN
  Administered 2021-08-30: 100 ug via INTRAVENOUS

## 2021-08-30 MED ORDER — POTASSIUM CHLORIDE 10 MEQ/100ML IV SOLN
10.0000 meq | INTRAVENOUS | Status: AC
  Administered 2021-08-30 (×3): 10 meq via INTRAVENOUS
  Filled 2021-08-30 (×3): qty 100

## 2021-08-30 MED ORDER — IOHEXOL 300 MG/ML  SOLN
100.0000 mL | Freq: Once | INTRAMUSCULAR | Status: AC | PRN
  Administered 2021-08-30: 75 mL via INTRA_ARTERIAL

## 2021-08-30 MED ORDER — EPHEDRINE SULFATE-NACL 50-0.9 MG/10ML-% IV SOSY
PREFILLED_SYRINGE | INTRAVENOUS | Status: DC | PRN
  Administered 2021-08-30: 5 mg via INTRAVENOUS

## 2021-08-30 MED ORDER — CLEVIDIPINE BUTYRATE 0.5 MG/ML IV EMUL: 0.0000 mg/h | INTRAVENOUS | Status: DC

## 2021-08-30 MED ORDER — ONDANSETRON HCL 4 MG/2ML IJ SOLN
INTRAMUSCULAR | Status: DC | PRN
  Administered 2021-08-30: 4 mg via INTRAVENOUS

## 2021-08-30 MED ORDER — POTASSIUM CHLORIDE IN NACL 20-0.9 MEQ/L-% IV SOLN
INTRAVENOUS | Status: AC
  Filled 2021-08-30 (×2): qty 1000

## 2021-08-30 MED ORDER — SUGAMMADEX SODIUM 200 MG/2ML IV SOLN
INTRAVENOUS | Status: DC | PRN
  Administered 2021-08-30 (×2): 100 mg via INTRAVENOUS

## 2021-08-30 MED ORDER — SODIUM CHLORIDE 0.9% FLUSH
3.0000 mL | Freq: Once | INTRAVENOUS | Status: AC
  Administered 2021-08-30: 3 mL via INTRAVENOUS

## 2021-08-30 MED ORDER — EPTIFIBATIDE 20 MG/10ML IV SOLN
INTRAVENOUS | Status: AC
  Filled 2021-08-30: qty 10

## 2021-08-30 MED ORDER — INSULIN ASPART 100 UNIT/ML IJ SOLN
0.0000 [IU] | Freq: Three times a day (TID) | INTRAMUSCULAR | Status: DC
  Administered 2021-08-31 – 2021-09-04 (×3): 3 [IU] via SUBCUTANEOUS

## 2021-08-30 MED ORDER — SUCCINYLCHOLINE CHLORIDE 200 MG/10ML IV SOSY
PREFILLED_SYRINGE | INTRAVENOUS | Status: DC | PRN
  Administered 2021-08-30: 120 mg via INTRAVENOUS

## 2021-08-30 MED ORDER — STROKE: EARLY STAGES OF RECOVERY BOOK
Freq: Once | Status: AC
Start: 2021-08-31 — End: 2021-08-31

## 2021-08-30 MED ORDER — CHLORHEXIDINE GLUCONATE CLOTH 2 % EX PADS
6.0000 | MEDICATED_PAD | Freq: Every day | CUTANEOUS | Status: DC
  Administered 2021-08-31: 6 via TOPICAL

## 2021-08-30 MED ORDER — LACTATED RINGERS IV SOLN: INTRAVENOUS | Status: DC | PRN

## 2021-08-30 MED ORDER — CLOPIDOGREL BISULFATE 300 MG PO TABS
ORAL_TABLET | ORAL | Status: AC
  Filled 2021-08-30: qty 1

## 2021-08-30 MED ORDER — ROCURONIUM BROMIDE 10 MG/ML (PF) SYRINGE
PREFILLED_SYRINGE | INTRAVENOUS | Status: DC | PRN
  Administered 2021-08-30: 50 mg via INTRAVENOUS

## 2021-08-30 MED ORDER — DEXAMETHASONE SODIUM PHOSPHATE 10 MG/ML IJ SOLN
INTRAMUSCULAR | Status: DC | PRN
  Administered 2021-08-30: 5 mg via INTRAVENOUS

## 2021-08-31 ENCOUNTER — Encounter (HOSPITAL_COMMUNITY): Payer: Self-pay | Admitting: Radiology

## 2021-08-31 ENCOUNTER — Inpatient Hospital Stay (HOSPITAL_COMMUNITY): Payer: Medicare HMO

## 2021-08-31 DIAGNOSIS — I420 Dilated cardiomyopathy: Secondary | ICD-10-CM

## 2021-08-31 LAB — GLUCOSE, CAPILLARY
Glucose-Capillary: 153 mg/dL — ABNORMAL HIGH (ref 70–99)
Glucose-Capillary: 101 mg/dL — ABNORMAL HIGH (ref 70–99)
Glucose-Capillary: 120 mg/dL — ABNORMAL HIGH (ref 70–99)
Glucose-Capillary: 108 mg/dL — ABNORMAL HIGH (ref 70–99)

## 2021-08-31 LAB — BRAIN NATRIURETIC PEPTIDE: B Natriuretic Peptide: 999.9 pg/mL — ABNORMAL HIGH (ref 0.0–100.0)

## 2021-08-31 MED ORDER — IVABRADINE HCL 5 MG PO TABS
10.0000 mg | ORAL_TABLET | Freq: Once | ORAL | Status: AC | PRN
  Administered 2021-09-01: 10 mg via ORAL

## 2021-08-31 MED ORDER — METOPROLOL TARTRATE 50 MG PO TABS: 50.0000 mg | ORAL_TABLET | Freq: Once | ORAL | Status: DC

## 2021-08-31 MED ORDER — PHENOL 1.4 % MT LIQD
1.0000 | OROMUCOSAL | Status: DC | PRN
  Administered 2021-08-31: 1 via OROMUCOSAL
  Filled 2021-08-31: qty 177

## 2021-08-31 MED ORDER — STUDY - OCEANIC-STROKE - ASUNDEXIAN 50 MG OR PLACEBO TABLET (PI-SETHI)
1.0000 | ORAL_TABLET | Freq: Every day | ORAL | Status: DC
  Administered 2021-08-31 – 2021-09-04 (×5): 50 mg via ORAL
  Filled 2021-08-31 (×5): qty 1

## 2021-08-31 MED ORDER — IVABRADINE HCL 5 MG PO TABS
10.0000 mg | ORAL_TABLET | Freq: Once | ORAL | Status: DC
  Filled 2021-08-31: qty 2

## 2021-09-01 ENCOUNTER — Other Ambulatory Visit: Payer: Self-pay

## 2021-09-01 ENCOUNTER — Inpatient Hospital Stay (HOSPITAL_COMMUNITY): Payer: Medicare HMO

## 2021-09-01 ENCOUNTER — Encounter (HOSPITAL_COMMUNITY): Payer: Self-pay | Admitting: Neurology

## 2021-09-01 DIAGNOSIS — R0602 Shortness of breath: Secondary | ICD-10-CM

## 2021-09-01 DIAGNOSIS — I639 Cerebral infarction, unspecified: Secondary | ICD-10-CM

## 2021-09-01 DIAGNOSIS — E785 Hyperlipidemia, unspecified: Secondary | ICD-10-CM

## 2021-09-01 DIAGNOSIS — I10 Essential (primary) hypertension: Secondary | ICD-10-CM

## 2021-09-01 LAB — BASIC METABOLIC PANEL
Anion gap: 4 — ABNORMAL LOW (ref 5–15)
BUN: 13 mg/dL (ref 6–20)
CO2: 26 mmol/L (ref 22–32)
Calcium: 8.4 mg/dL — ABNORMAL LOW (ref 8.9–10.3)
Chloride: 111 mmol/L (ref 98–111)
Creatinine, Ser: 1.04 mg/dL — ABNORMAL HIGH (ref 0.44–1.00)
GFR, Estimated: >60 mL/min (ref >60)
Glucose, Bld: 112 mg/dL — ABNORMAL HIGH (ref 70–99)
Potassium: 3.6 mmol/L (ref 3.5–5.1)
Sodium: 141 mmol/L (ref 135–145)

## 2021-09-01 LAB — GLUCOSE, CAPILLARY
Glucose-Capillary: 109 mg/dL — ABNORMAL HIGH (ref 70–99)
Glucose-Capillary: 94 mg/dL (ref 70–99)
Glucose-Capillary: 130 mg/dL — ABNORMAL HIGH (ref 70–99)
Glucose-Capillary: 121 mg/dL — ABNORMAL HIGH (ref 70–99)

## 2021-09-01 LAB — BRAIN NATRIURETIC PEPTIDE: B Natriuretic Peptide: 1548.4 pg/mL — ABNORMAL HIGH (ref 0.0–100.0)

## 2021-09-01 MED ORDER — ALBUTEROL SULFATE HFA 108 (90 BASE) MCG/ACT IN AERS
2.0000 | INHALATION_SPRAY | Freq: Three times a day (TID) | RESPIRATORY_TRACT | Status: DC | PRN
Start: 2021-09-01 — End: 2021-09-01

## 2021-09-01 MED ORDER — ALBUTEROL SULFATE (2.5 MG/3ML) 0.083% IN NEBU
2.5000 mg | INHALATION_SOLUTION | Freq: Three times a day (TID) | RESPIRATORY_TRACT | Status: DC | PRN
Start: 2021-09-01 — End: 2021-09-06
  Administered 2021-09-01 – 2021-09-02 (×3): 2.5 mg via RESPIRATORY_TRACT
  Filled 2021-09-01 (×3): qty 3

## 2021-09-01 MED ORDER — FUROSEMIDE 10 MG/ML IJ SOLN
40.0000 mg | Freq: Once | INTRAMUSCULAR | Status: AC
  Administered 2021-09-01: 40 mg via INTRAVENOUS
  Filled 2021-09-01: qty 4

## 2021-09-01 MED ORDER — POTASSIUM CHLORIDE CRYS ER 20 MEQ PO TBCR
40.0000 meq | EXTENDED_RELEASE_TABLET | Freq: Once | ORAL | Status: AC
  Administered 2021-09-01: 40 meq via ORAL
  Filled 2021-09-01: qty 2

## 2021-09-02 ENCOUNTER — Encounter (HOSPITAL_COMMUNITY)
Admission: EM | Disposition: A | Discharge status: Home / Self Care | Payer: Self-pay | Source: Home / Self Care | Attending: Neurology

## 2021-09-02 ENCOUNTER — Inpatient Hospital Stay (HOSPITAL_COMMUNITY): Payer: Medicare HMO | Admitting: Anesthesiology

## 2021-09-02 ENCOUNTER — Inpatient Hospital Stay (HOSPITAL_COMMUNITY): Payer: Medicare HMO

## 2021-09-02 ENCOUNTER — Encounter (HOSPITAL_COMMUNITY): Payer: Self-pay | Admitting: Neurology

## 2021-09-02 DIAGNOSIS — I361 Nonrheumatic tricuspid (valve) insufficiency: Secondary | ICD-10-CM

## 2021-09-02 DIAGNOSIS — I10 Essential (primary) hypertension: Secondary | ICD-10-CM

## 2021-09-02 DIAGNOSIS — Z8673 Personal history of transient ischemic attack (TIA), and cerebral infarction without residual deficits: Secondary | ICD-10-CM

## 2021-09-02 DIAGNOSIS — I340 Nonrheumatic mitral (valve) insufficiency: Secondary | ICD-10-CM

## 2021-09-02 DIAGNOSIS — I088 Other rheumatic multiple valve diseases: Secondary | ICD-10-CM

## 2021-09-02 DIAGNOSIS — I428 Other cardiomyopathies: Secondary | ICD-10-CM

## 2021-09-02 DIAGNOSIS — I5021 Acute systolic (congestive) heart failure: Secondary | ICD-10-CM

## 2021-09-02 HISTORY — PX: TEE WITHOUT CARDIOVERSION: SHX5443

## 2021-09-02 HISTORY — PX: BUBBLE STUDY: SHX6837

## 2021-09-02 HISTORY — PX: RIGHT/LEFT HEART CATH AND CORONARY ANGIOGRAPHY: CATH118266

## 2021-09-02 LAB — GLUCOSE, CAPILLARY
Glucose-Capillary: 115 mg/dL — ABNORMAL HIGH (ref 70–99)
Glucose-Capillary: 88 mg/dL (ref 70–99)
Glucose-Capillary: 120 mg/dL — ABNORMAL HIGH (ref 70–99)
Glucose-Capillary: 93 mg/dL (ref 70–99)

## 2021-09-02 LAB — BASIC METABOLIC PANEL WITH GFR
Anion gap: 7 (ref 5–15)
BUN: 11 mg/dL (ref 6–20)
CO2: 24 mmol/L (ref 22–32)
Calcium: 8.5 mg/dL — ABNORMAL LOW (ref 8.9–10.3)
Chloride: 110 mmol/L (ref 98–111)
Creatinine, Ser: 1 mg/dL (ref 0.44–1.00)
GFR, Estimated: >60 mL/min
Glucose, Bld: 110 mg/dL — ABNORMAL HIGH (ref 70–99)
Potassium: 3.8 mmol/L (ref 3.5–5.1)
Sodium: 141 mmol/L (ref 135–145)

## 2021-09-02 LAB — POCT I-STAT EG7
Acid-Base Excess: 1 mmol/L (ref 0.0–2.0)
Bicarbonate: 24.9 mmol/L (ref 20.0–28.0)
Calcium, Ion: 1.22 mmol/L (ref 1.15–1.40)
HCT: 35 % — ABNORMAL LOW (ref 36.0–46.0)
Hemoglobin: 11.9 g/dL — ABNORMAL LOW (ref 12.0–15.0)
O2 Saturation: 61 %
Potassium: 3.5 mmol/L (ref 3.5–5.1)
Sodium: 142 mmol/L (ref 135–145)
TCO2: 26 mmol/L (ref 22–32)
pCO2, Ven: 36.2 mmHg — ABNORMAL LOW (ref 44–60)
pH, Ven: 7.444 — ABNORMAL HIGH (ref 7.25–7.43)
pO2, Ven: 30 mmHg — CL (ref 32–45)

## 2021-09-02 SURGERY — RIGHT/LEFT HEART CATH AND CORONARY ANGIOGRAPHY
Anesthesia: LOCAL

## 2021-09-02 SURGERY — ECHOCARDIOGRAM, TRANSESOPHAGEAL
Anesthesia: Monitor Anesthesia Care

## 2021-09-02 MED ORDER — MIDAZOLAM HCL 2 MG/2ML IJ SOLN
INTRAMUSCULAR | Status: DC | PRN
  Administered 2021-09-02 (×2): 1 mg via INTRAVENOUS

## 2021-09-02 MED ORDER — VERAPAMIL HCL 2.5 MG/ML IV SOLN
INTRAVENOUS | Status: DC | PRN
  Administered 2021-09-02: 10 mL via INTRA_ARTERIAL

## 2021-09-02 MED ORDER — FENTANYL CITRATE (PF) 100 MCG/2ML IJ SOLN
INTRAMUSCULAR | Status: DC | PRN
  Administered 2021-09-02: 25 ug via INTRAVENOUS

## 2021-09-02 MED ORDER — SODIUM CHLORIDE 0.9% FLUSH: 3.0000 mL | INTRAVENOUS | Status: DC | PRN

## 2021-09-02 MED ORDER — PROPOFOL 10 MG/ML IV BOLUS
INTRAVENOUS | Status: DC | PRN
  Administered 2021-09-02 (×2): 10 mg via INTRAVENOUS

## 2021-09-02 MED ORDER — VERAPAMIL HCL 2.5 MG/ML IV SOLN
INTRAVENOUS | Status: DC | PRN
  Administered 2021-09-02: 5 mg via INTRA_ARTERIAL

## 2021-09-02 MED ORDER — DEXMEDETOMIDINE HCL IN NACL 200 MCG/50ML IV SOLN
INTRAVENOUS | Status: DC | PRN
  Administered 2021-09-02: 1 ug/kg/h via INTRAVENOUS

## 2021-09-02 MED ORDER — SODIUM CHLORIDE 0.9 % IV SOLN: 250.0000 mL | INTRAVENOUS | Status: DC | PRN

## 2021-09-02 MED ORDER — SODIUM CHLORIDE 0.9% FLUSH
3.0000 mL | INTRAVENOUS | Status: DC | PRN
Start: 2021-09-02 — End: 2021-09-06

## 2021-09-02 MED ORDER — LIDOCAINE HCL (PF) 1 % IJ SOLN
INTRAMUSCULAR | Status: AC
  Filled 2021-09-02: qty 30

## 2021-09-02 MED ORDER — FENTANYL CITRATE (PF) 100 MCG/2ML IJ SOLN
INTRAMUSCULAR | Status: AC
  Filled 2021-09-02: qty 2

## 2021-09-02 MED ORDER — IOHEXOL 350 MG/ML SOLN
INTRAVENOUS | Status: DC | PRN
  Administered 2021-09-02: 45 mL

## 2021-09-02 MED ORDER — HEPARIN (PORCINE) IN NACL 1000-0.9 UT/500ML-% IV SOLN
INTRAVENOUS | Status: AC
  Filled 2021-09-02: qty 500

## 2021-09-02 MED ORDER — ACETAMINOPHEN 325 MG PO TABS
650.0000 mg | ORAL_TABLET | ORAL | Status: DC | PRN
  Administered 2021-09-02: 650 mg via ORAL

## 2021-09-02 MED ORDER — SODIUM CHLORIDE 0.9 % IV SOLN: INTRAVENOUS | Status: DC

## 2021-09-02 MED ORDER — HYDRALAZINE HCL 20 MG/ML IJ SOLN: 10.0000 mg | INTRAMUSCULAR | Status: AC | PRN

## 2021-09-02 MED ORDER — HEPARIN (PORCINE) IN NACL 1000-0.9 UT/500ML-% IV SOLN
INTRAVENOUS | Status: AC
  Filled 2021-09-02: qty 1000

## 2021-09-02 MED ORDER — SODIUM CHLORIDE 0.9% FLUSH: 3.0000 mL | Freq: Two times a day (BID) | INTRAVENOUS | Status: DC

## 2021-09-02 MED ORDER — ASPIRIN 81 MG PO CHEW: 81.0000 mg | CHEWABLE_TABLET | ORAL | Status: DC

## 2021-09-02 MED ORDER — HEPARIN (PORCINE) IN NACL 1000-0.9 UT/500ML-% IV SOLN
INTRAVENOUS | Status: DC | PRN
  Administered 2021-09-02 (×2): 500 mL

## 2021-09-02 MED ORDER — ETOMIDATE 2 MG/ML IV SOLN
INTRAVENOUS | Status: DC | PRN
  Administered 2021-09-02: 2 mg via INTRAVENOUS
  Administered 2021-09-02: 6 mg via INTRAVENOUS
  Administered 2021-09-02: 2 mg via INTRAVENOUS

## 2021-09-02 MED ORDER — LIDOCAINE HCL (PF) 1 % IJ SOLN
INTRAMUSCULAR | Status: DC | PRN
  Administered 2021-09-02 (×2): 2 mL

## 2021-09-02 MED ORDER — HEPARIN SODIUM (PORCINE) 1000 UNIT/ML IJ SOLN
INTRAMUSCULAR | Status: DC | PRN
  Administered 2021-09-02: 4000 [IU] via INTRAVENOUS

## 2021-09-02 MED ORDER — ONDANSETRON HCL 4 MG/2ML IJ SOLN: 4.0000 mg | Freq: Four times a day (QID) | INTRAMUSCULAR | Status: DC | PRN

## 2021-09-02 MED ORDER — LABETALOL HCL 5 MG/ML IV SOLN: 10.0000 mg | INTRAVENOUS | Status: AC | PRN

## 2021-09-02 MED ORDER — SODIUM CHLORIDE 0.9% FLUSH
3.0000 mL | Freq: Two times a day (BID) | INTRAVENOUS | Status: DC
  Administered 2021-09-02 – 2021-09-06 (×6): 3 mL via INTRAVENOUS

## 2021-09-02 MED ORDER — VERAPAMIL HCL 2.5 MG/ML IV SOLN
INTRAVENOUS | Status: AC
  Filled 2021-09-02: qty 2

## 2021-09-02 MED ORDER — MIDAZOLAM HCL 2 MG/2ML IJ SOLN
INTRAMUSCULAR | Status: AC
  Filled 2021-09-02: qty 2

## 2021-09-02 MED ORDER — LACTATED RINGERS IV SOLN: INTRAVENOUS | Status: DC

## 2021-09-02 SURGICAL SUPPLY — 13 items
BAND CMPR LRG ZPHR (HEMOSTASIS) ×1
BAND ZEPHYR COMPRESS 30 LONG (HEMOSTASIS) ×1 IMPLANT
CATH 5FR JL3.5 JR4 ANG PIG MP (CATHETERS) ×1 IMPLANT
CATH BALLN WEDGE 5F 110CM (CATHETERS) ×1 IMPLANT
GLIDESHEATH SLEND SS 6F .021 (SHEATH) ×1 IMPLANT
GUIDEWIRE INQWIRE 1.5J.035X260 (WIRE) IMPLANT
INQWIRE 1.5J .035X260CM (WIRE) ×2
KIT HEART LEFT (KITS) ×3 IMPLANT
PACK CARDIAC CATHETERIZATION (CUSTOM PROCEDURE TRAY) ×3 IMPLANT
SHEATH GLIDE SLENDER 4/5FR (SHEATH) ×1 IMPLANT
SHEATH PROBE COVER 6X72 (BAG) ×1 IMPLANT
TRANSDUCER W/STOPCOCK (MISCELLANEOUS) ×3 IMPLANT
TUBING CIL FLEX 10 FLL-RA (TUBING) ×3 IMPLANT

## 2021-09-02 NOTE — Progress Notes (Signed)
  Echocardiogram Echocardiogram Transesophageal has been performed.  Gina Nichols 09/02/2021, 11:46 AM

## 2021-09-02 NOTE — Progress Notes (Signed)
Physical Therapy Treatment Patient Details Name: Gina Nichols MRN: [id number] DOB: [birth year] Today's Date: 09/02/2021   History of Present Illness Pt is a 51 y.o. female who presented with acute onset of right sided weakness, facial droop and aphasia. 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 CTA 6/19: Occlusion of the proximal left M2, with possible distal reconstitution but relatively poor perfusion in the left MCA territory. Pt s/p mechanical thrombectomy 6/19.    PT Comments    The pt was agreeable to session, continues to demo slow but steady progress with balance deficits. She was able to complete 150 ft hallway ambulation prior to needing standing rest break, and then completed multiple dynamic balance challenges without or with limited single UE support. The pt did have single LOB but recovered with minA only. Continues to be at elevated risk of falls, will continue to benefit from skilled PT to progress functional strength, power, and dynamic stability.   Gait Speed: 0.17ms. (Gait speed <0.664m indicates increased risk of falls and dependence in ADLs)    Recommendations for follow up therapy are one component of a multi-disciplinary discharge planning process, led by the attending physician.  Recommendations may be updated based on patient status, additional functional criteria and insurance authorization.  Follow Up Recommendations  No PT follow up     Assistance Recommended at Discharge Intermittent Supervision/Assistance  Patient can return home with the following A little help with walking and/or transfers;Assistance with cooking/housework;Assist for transportation;Direct supervision/assist for medications management;A  little help with bathing/dressing/bathroom   Equipment Recommendations  None recommended by PT    Recommendations for Other Services       Precautions / Restrictions Precautions Precautions: Fall Restrictions Weight Bearing Restrictions: No     Mobility  Bed Mobility Overal bed mobility: Needs Assistance Bed Mobility: Supine to Sit, Sit to Supine     Supine to sit: Supervision Sit to supine: Supervision   General bed mobility comments: supervision, slightly increased time    Transfers Overall transfer level: Needs assistance Equipment used: None Transfers: Sit to/from Stand Sit to Stand: Min guard           General transfer comment: Min guard for safety. Stood from EOGoogleith significantly increased time and effort, then completed x10 from blue chair in room (x3 with hands on knees and x7 without use of UE)    Ambulation/Gait Ambulation/Gait assistance: Min guard Gait Distance (Feet): 150 Feet (+ 75 ft) Assistive device: None (rail PRN) Gait Pattern/deviations: Step-through pattern, Decreased stride length, Decreased weight shift to right, Decreased stance time - right, Staggering right, Staggering left Gait velocity: 0.5 m/s self-selected speed, 0.71 m/s when asked to walk "fast" Gait velocity interpretation: 1.31 - 2.62 ft/sec, indicative of limited community ambulator   General Gait Details: pt with mild instability and x1 overt LOB due to tripping on shoes with balance challenge. the pt was able to recover with minA. slowed speed and increased sway with challenge. HR to 112    Modified Rankin (Stroke Patients Only) Modified Rankin (Stroke Patients Only) Pre-Morbid Rankin Score: No significant disability Modified Rankin: Moderately severe disability     Balance Overall balance assessment: Needs assistance Sitting-balance support: Feet supported, No upper extremity supported Sitting balance-Leahy Scale: Good     Standing balance support:  No upper  extremity supported, During functional activity Standing balance-Leahy Scale: Fair Standing balance comment: can ambulate without UE support, prefers UE support with challenge             High level balance activites: Backward walking, Direction changes, Turns, Head turns, Other (comment) (tandem walking) High Level Balance Comments: slowed speed, increased use of rail to steady Standardized Balance Assessment Standardized Balance Assessment : Dynamic Gait Index   Dynamic Gait Index Level Surface: Mild Impairment Change in Gait Speed: Mild Impairment Gait with Horizontal Head Turns: Moderate Impairment Gait with Vertical Head Turns: Mild Impairment Gait and Pivot Turn: Mild Impairment Step Around Obstacles: Normal      Cognition Arousal/Alertness: Awake/alert Behavior During Therapy: WFL for tasks assessed/performed Overall Cognitive Status: Impaired/Different from baseline Area of Impairment: Attention, Memory                   Current Attention Level: Selective Memory: Decreased short-term memory Following Commands: Follows one step commands consistently   Awareness: Emergent Problem Solving: Slow processing General Comments: Pt states she has been dealing wtih cognitive deficits since her brain tumor, but they are now exacerbated. pt with slowed processing and at times difficulty starting her sentences, but able to express needs and follow commands        Exercises Other Exercises Other Exercises: repeated sit-stand from blue chair, x3 with hands on knees, x7 without use of UE    General Comments        Pertinent Vitals/Pain Pain Assessment Pain Assessment: No/denies pain Faces Pain Scale: No hurt Pain Intervention(s): Monitored during session     PT Goals (current goals can now be found in the care plan section) Acute Rehab PT Goals Patient Stated Goal: to return to independent PT Goal Formulation: With patient/family Time For Goal Achievement:  09/14/21 Potential to Achieve Goals: Good Progress towards PT goals: Progressing toward goals    Frequency    Min 4X/week      PT Plan Current plan remains appropriate       AM-PAC PT "6 Clicks" Mobility   Outcome Measure  Help needed turning from your back to your side while in a flat bed without using bedrails?: None Help needed moving from lying on your back to sitting on the side of a flat bed without using bedrails?: None Help needed moving to and from a bed to a chair (including a wheelchair)?: A Little Help needed standing up from a chair using your arms (e.g., wheelchair or bedside chair)?: A Little Help needed to walk in hospital room?: A Little Help needed climbing 3-5 steps with a railing? : A Little 6 Click Score: 20    End of Session Equipment Utilized During Treatment: Gait belt Activity Tolerance: Patient tolerated treatment well Patient left: in bed;with call bell/phone within reach Nurse Communication: Mobility status PT Visit Diagnosis: Other abnormalities of gait and mobility (R26.89);Other symptoms and signs involving the nervous system (V03.500)     Time: 9381-8299 PT Time Calculation (min) (ACUTE ONLY): 25 min  Charges:  $Gait Training: 8-22 mins $Neuromuscular Re-education: 8-22 mins                     West Carbo, PT, DPT   Acute Rehabilitation Department   Gina Nichols 09/02/2021, 9:14 AM

## 2021-09-02 NOTE — Care Management Important Message (Signed)
Important Message  Patient Details  Name: Gina Nichols MRN: [id number] Date of Birth: 27-Jun-1970   Medicare Important Message Given:  Yes     [name] 09/02/2021, 2:35 PM

## 2021-09-03 ENCOUNTER — Encounter (HOSPITAL_COMMUNITY): Payer: Self-pay | Admitting: Internal Medicine

## 2021-09-03 DIAGNOSIS — I5021 Acute systolic (congestive) heart failure: Secondary | ICD-10-CM

## 2021-09-03 LAB — TSH: TSH: 3.133 u[IU]/mL (ref 0.350–4.500)

## 2021-09-03 LAB — POCT I-STAT 7, (LYTES, BLD GAS, ICA,H+H)
Acid-Base Excess: 0 mmol/L (ref 0.0–2.0)
Bicarbonate: 22.4 mmol/L (ref 20.0–28.0)
Calcium, Ion: 1.19 mmol/L (ref 1.15–1.40)
HCT: 34 % — ABNORMAL LOW (ref 36.0–46.0)
Hemoglobin: 11.6 g/dL — ABNORMAL LOW (ref 12.0–15.0)
O2 Saturation: 94 %
Potassium: 3.4 mmol/L — ABNORMAL LOW (ref 3.5–5.1)
Sodium: 142 mmol/L (ref 135–145)
TCO2: 23 mmol/L (ref 22–32)
pCO2 arterial: 28.3 mmHg — ABNORMAL LOW (ref 32–48)
pH, Arterial: 7.507 — ABNORMAL HIGH (ref 7.35–7.45)
pO2, Arterial: 61 mmHg — ABNORMAL LOW (ref 83–108)

## 2021-09-03 LAB — BASIC METABOLIC PANEL
Anion gap: 8 (ref 5–15)
BUN: 11 mg/dL (ref 6–20)
CO2: 24 mmol/L (ref 22–32)
Calcium: 8.5 mg/dL — ABNORMAL LOW (ref 8.9–10.3)
Chloride: 109 mmol/L (ref 98–111)
Creatinine, Ser: 0.91 mg/dL (ref 0.44–1.00)
GFR, Estimated: >60 mL/min (ref >60)
Glucose, Bld: 95 mg/dL (ref 70–99)
Potassium: 3.9 mmol/L (ref 3.5–5.1)
Sodium: 141 mmol/L (ref 135–145)

## 2021-09-03 LAB — POCT I-STAT EG7
Acid-Base Excess: 1 mmol/L (ref 0.0–2.0)
Bicarbonate: 25.2 mmol/L (ref 20.0–28.0)
Calcium, Ion: 1.18 mmol/L (ref 1.15–1.40)
HCT: 34 % — ABNORMAL LOW (ref 36.0–46.0)
Hemoglobin: 11.6 g/dL — ABNORMAL LOW (ref 12.0–15.0)
O2 Saturation: 62 %
Potassium: 3.4 mmol/L — ABNORMAL LOW (ref 3.5–5.1)
Sodium: 142 mmol/L (ref 135–145)
TCO2: 26 mmol/L (ref 22–32)
pCO2, Ven: 36.7 mmHg — ABNORMAL LOW (ref 44–60)
pH, Ven: 7.444 — ABNORMAL HIGH (ref 7.25–7.43)
pO2, Ven: 31 mmHg — CL (ref 32–45)

## 2021-09-03 LAB — GLUCOSE, CAPILLARY
Glucose-Capillary: 112 mg/dL — ABNORMAL HIGH (ref 70–99)
Glucose-Capillary: 152 mg/dL — ABNORMAL HIGH (ref 70–99)
Glucose-Capillary: 92 mg/dL (ref 70–99)
Glucose-Capillary: 114 mg/dL — ABNORMAL HIGH (ref 70–99)

## 2021-09-03 LAB — CBC
HCT: 35.4 % — ABNORMAL LOW (ref 36.0–46.0)
Hemoglobin: 11.6 g/dL — ABNORMAL LOW (ref 12.0–15.0)
MCH: 32.3 pg (ref 26.0–34.0)
MCHC: 32.8 g/dL (ref 30.0–36.0)
MCV: 98.6 fL (ref 80.0–100.0)
Platelets: 183 10*3/uL (ref 150–400)
RBC: 3.59 MIL/uL — ABNORMAL LOW (ref 3.87–5.11)
RDW: 15.6 % — ABNORMAL HIGH (ref 11.5–15.5)
WBC: 5.6 10*3/uL (ref 4.0–10.5)
nRBC: 0 % (ref 0.0–0.2)

## 2021-09-03 MED ORDER — SPIRONOLACTONE 12.5 MG HALF TABLET
12.5000 mg | ORAL_TABLET | Freq: Every day | ORAL | Status: DC
Start: 2021-09-03 — End: 2021-09-06
  Administered 2021-09-03 – 2021-09-06 (×4): 12.5 mg via ORAL
  Filled 2021-09-03 (×4): qty 1

## 2021-09-03 MED ORDER — DIGOXIN 125 MCG PO TABS
0.1250 mg | ORAL_TABLET | Freq: Every day | ORAL | Status: DC
  Administered 2021-09-03 – 2021-09-06 (×4): 0.125 mg via ORAL
  Filled 2021-09-03 (×4): qty 1

## 2021-09-03 MED ORDER — POLYVINYL ALCOHOL 1.4 % OP SOLN
1.0000 [drp] | OPHTHALMIC | Status: DC | PRN
Start: 2021-09-03 — End: 2021-09-06

## 2021-09-03 MED ORDER — POTASSIUM CHLORIDE CRYS ER 20 MEQ PO TBCR
40.0000 meq | EXTENDED_RELEASE_TABLET | Freq: Every day | ORAL | Status: DC
  Administered 2021-09-03 – 2021-09-04 (×2): 40 meq via ORAL
  Filled 2021-09-03 (×2): qty 2

## 2021-09-03 MED ORDER — FUROSEMIDE 10 MG/ML IJ SOLN
40.0000 mg | Freq: Two times a day (BID) | INTRAMUSCULAR | Status: DC
  Administered 2021-09-03 – 2021-09-04 (×3): 40 mg via INTRAVENOUS
  Filled 2021-09-03 (×3): qty 4

## 2021-09-03 NOTE — Progress Notes (Signed)
PT Cancellation Note  Patient Details Name: Gina Nichols MRN: [id number] DOB: 07/03/1970   Cancelled Treatment:    Reason Eval/Treat Not Completed: (P) Other (comment) (MD present at bed side with patient will f/u.)   Zuhair Lariccia Eli Hose 09/03/2021, 11:13 AM

## 2021-09-03 NOTE — Progress Notes (Incomplete)
Introduction: 51 YO F, presented 6/19 with cc of stroke.  History of present illness: Pt reports she woke up feeling confused, had right sided facial droop/weakness and aphasia. No PMH of stroke, but does have PMH of brain tumor. Sister reports lower extremity edema developing over the past few weeks.  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  FH: not on file  PTA meds:  Bisoprolol-HCT, farxiga, Vit D2, furosemide, levocetirizine, pantoprozol, and entresto  Social hx: smokes cigars occasionally  Physical exam/relevant labs, diagnostic testing/imaging: Assessment confirms s/sx reported by patient and family (right sided weakness, aphasia). Exams/CT/IR reveal stroke in the left MCA (middle cerebral artery) territory. NIHSS of 20 (based on speech, gaze, visual and motor arm/leg instructions, ataxia, dysarthria, etc. Score of 15-20 is indicative of a moderate to severe stroke). Seems like some improvement after assessment, NIHSS decreased. Pt underwent echocardiogram which showed EF of 20-25%. Cardiology consulted on the 20th for CHF, at this time, pt reports chest pain with unspecified onset date. Also reports piece of tissue removed from heart in 2012.  Had a TEE (to rule out thrombus) and R/L cardiac cath + coronary angiography-> No apparent CAD, moderate pulmonary HTN, EF < 25%. Consulted advanced HF team. Started on aldactone, digoxin, and atorvastatin.  Aldactone- can help with fluid overload/sodium balance   Digoxin- inhibits Na/K+ ATPase pump, increases intracellular [Na+] which also increases intracellular [Ca2+] via the Na+/Ca2+ pump, and helps increase heart contractility. Entresto- valsartan and sacubitril- ARB (block AGTN II from binding, promotes vasodilation) and neprilysin inhibitor (increases BNP, diuresis, vasodilation)   Could put  her on milrinone (Scr looks ok) if CO is still low.    Started on asundexian trial (should have also been started on an antiplatelet therapy as well, cannot be on DOAC tx even though that was recommended).   Problem based assessment and plan:  - Problem 1:  A. Assessment:  B. Plan:  - Problem 2:  A. Assessment:  B. Plan:  - Problem 3:  A. Assessment:  B. Plan:

## 2021-09-03 NOTE — Progress Notes (Signed)
Physical Therapy Treatment Patient Details Name: Gina Nichols MRN: [id number] DOB: [birth year] Today's Date: 09/03/2021   History of Present Illness 51 y.o. female who presented with acute onset of right sided weakness, facial droop and aphasia. 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 CTA 6/19: Occlusion of the proximal left M2, with possible distal reconstitution but relatively poor perfusion in the left MCA territory. Pt s/p mechanical thrombectomy 6/19. Cardiac cath 09/02/21 with no evidence of CAD.    PT Comments    Focused session on dynamic balance training, challenging pt's gait stability by performing the DGI and other various stepping patterns. Pt is displaying improved stability as she was able to ambulate without UE support at a min guard-supervision level, but still displayed mild balance deficits in that she ambulated slower and had to adjust her speed or take reactional steps to maintain her balance with dynamic gait challenges. Pt scored 18/24 on the DGI today, whereas she scored 17/24 on 09/01/21. Educated pt and her family that she still remains at risk for falls and to still supervise her with mobility for safety purposes at this time. They verbalized understanding. Will continue to follow acutely. Current recommendations remain appropriate.     Recommendations for follow up therapy are one component of a multi-disciplinary discharge planning process, led by the attending physician.  Recommendations may be updated based on patient status, additional functional criteria and insurance authorization.  Follow Up Recommendations  No PT follow up     Assistance Recommended at Discharge Intermittent Supervision/Assistance   Patient can return home with the following A little help with walking and/or transfers;Assistance with cooking/housework;Assist for transportation;Direct supervision/assist for medications management;A little help with bathing/dressing/bathroom;Direct supervision/assist for financial management   Equipment Recommendations  None recommended by PT    Recommendations for Other Services       Precautions / Restrictions Precautions Precautions: Fall Precaution Comments: R radial access cardiac cath 6/22 Restrictions Weight Bearing Restrictions: No     Mobility  Bed Mobility Overal bed mobility: Modified Independent Bed Mobility: Supine to Sit, Sit to Supine     Supine to sit: HOB elevated, Modified independent (Device/Increase time) Sit to supine: HOB elevated, Modified independent (Device/Increase time)   General bed mobility comments: Mod I for extra time and HOB elevated    Transfers Overall transfer level: Needs assistance Equipment used: None Transfers: Sit to/from Stand Sit to Stand: Supervision           General transfer comment: Supervision for safety, no LOB    Ambulation/Gait Ambulation/Gait assistance: Min guard, Supervision Gait Distance (Feet): 360 Feet Assistive device: None Gait Pattern/deviations: Step-through pattern, Decreased stride length, Decreased weight shift to right, Decreased stance time - right Gait velocity: reduced Gait velocity interpretation: 1.31 - 2.62 ft/sec, indicative of limited community ambulator   General Gait Details: Pt with improved stability, but ambulates slowly and with caution and mild instability noted, but no LOB when ambulating forward and with all DGI tasks except stepping over an obstacle, in which her foot slipped slightly but she recovered with min guard. Step reactional strategies used at times with braided steps or tandem steps anteriorly or posteriorly. min guard-supervision throughout for  safety   Stairs Stairs: Yes Stairs assistance: Supervision, Min guard Stair Management: No rails, Alternating pattern, Step  to pattern, Forwards Number of Stairs: 5 (x3 small steps, x2 standard steps) General stair comments: Pt performing reciprocal step pattern on short steps ascending and descending but performing step-to pattern on standard steps ascending and descending, no UE support, min guard-supervision for safety, no LOB   Wheelchair Mobility    Modified Rankin (Stroke Patients Only) Modified Rankin (Stroke Patients Only) Pre-Morbid Rankin Score: No significant disability Modified Rankin: Moderately severe disability     Balance Overall balance assessment: Needs assistance Sitting-balance support: Feet supported, No upper extremity supported Sitting balance-Leahy Scale: Good     Standing balance support: No upper extremity supported, During functional activity Standing balance-Leahy Scale: Good Standing balance comment: Can perform DGI and do braided steps bil, anterior/posterior tandem steps, and ambulate backwards with min guard assist and occasional use of reactional step strategies to maintain balance.             High level balance activites: Braiding, Backward walking, Direction changes, Turns, Sudden stops, Head turns, Other (comment) (tandem walking forward and backward) High Level Balance Comments: Cued pt to perform DGI along with braided side stepping bil >10 steps, backwards stepping > 10 steps, tandem stepping forward 2x > 10 steps, and tandem stepping backwards > 10 steps; min guard for safety; step reactional strategies with braided and tandem stepping forward and backward Standardized Balance Assessment Standardized Balance Assessment : Dynamic Gait Index   Dynamic Gait Index Level Surface: Mild Impairment Change in Gait Speed: Normal Gait with Horizontal Head Turns: Mild Impairment Gait with Vertical Head Turns: Mild Impairment Gait and Pivot  Turn: Normal Step Over Obstacle: Mild Impairment Step Around Obstacles: Mild Impairment Steps: Mild Impairment Total Score: 18      Cognition Arousal/Alertness: Awake/alert Behavior During Therapy: WFL for tasks assessed/performed Overall Cognitive Status: Impaired/Different from baseline Area of Impairment: Attention, Memory, Following commands, Awareness, Problem solving                   Current Attention Level: Selective Memory: Decreased short-term memory Following Commands: Follows one step commands consistently   Awareness: Emergent Problem Solving: Slow processing General Comments: Pt needing increased time and extra instructions with cuing.        Exercises Other Exercises Other Exercises: For balance training: Cued pt to perform DGI along with braided side stepping bil >10 steps, backwards stepping > 10 steps, tandem stepping forward 2x > 10 steps, and tandem stepping backwards > 10 steps; min guard for safety; step reactional strategies with braided and tandem stepping forward and backward    General Comments        Pertinent Vitals/Pain Pain Assessment Pain Assessment: No/denies pain    Home Living                          Prior Function            PT Goals (current goals can now be found in the care plan section) Acute Rehab PT Goals Patient Stated Goal: to improve PT Goal Formulation: With patient/family Time For Goal Achievement: 09/14/21 Potential to Achieve Goals: Good Progress towards PT goals: Progressing toward goals    Frequency    Min 4X/week      PT Plan Current plan remains appropriate    Co-evaluation              AM-PAC PT "6 Clicks" Mobility   Outcome Measure  Help needed turning from your back to your side while in a  flat bed without using bedrails?: None Help needed moving from lying on your back to sitting on the side of a flat bed without using bedrails?: None Help needed moving to and from a bed  to a chair (including a wheelchair)?: A Little Help needed standing up from a chair using your arms (e.g., wheelchair or bedside chair)?: A Little Help needed to walk in hospital room?: A Little Help needed climbing 3-5 steps with a railing? : A Little 6 Click Score: 20    End of Session Equipment Utilized During Treatment: Gait belt Activity Tolerance: Patient tolerated treatment well Patient left: in bed;with call bell/phone within reach;with bed alarm set;with family/visitor present   PT Visit Diagnosis: Other abnormalities of gait and mobility (R26.89);Other symptoms and signs involving the nervous system (R29.898);Unsteadiness on feet (R26.81)     Time: 2706-2376 PT Time Calculation (min) (ACUTE ONLY): 26 min  Charges:  $Gait Training: 8-22 mins $Neuromuscular Re-education: 8-22 mins                     Moishe Spice, PT, DPT Acute Rehabilitation Services  Office: [phone]    Orvan Falconer 09/03/2021, 6:04 PM

## 2021-09-03 NOTE — Consult Note (Addendum)
Advanced Heart Failure Team Consult Note   Primary Physician: [place] PCP-Cardiologist:  None  Reason for Consultation: Acute HFrEF   HPI:    Gina Nichols is seen today for evaluation of acute HFrEF at the request of Dr Angelena Form. She has another Geographical information systems officer with previous last name Gina Nichols. Patient placement working to merge charts.   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    She has not been seen by Dr Nicky Pugh in several years. Previously on coreg. She tells me she gets easily frustrated and will stop taking her medications.   Developed SOB with exertion and orthopnea 3 weeks ago.SOB walking in her home and required rest breaks.   Presented with R hemiplegia/right facial droop, and hemiplegia. Admitted with code stroke. CTA head/neck with left MCA territory infarct. Underwent mechanical thrombectomy. Echo with newly reduced EF 20-25%. . Cardiology consulted. TEE EF < 20% RV normal, no thrombus, and no PFO.  Had Cath with normal coronaries. PA Sat 62% , RA 12, PA 53/32 (41), PCWP 33, CO 4.6, and CI 2.4 . Today she was placed on IV lasix 40 mg twice a day. I/O not accurate.   Neuro deficits resolving. Remains SOB with exertion.   Review of Systems: [y] = yes, '[ ]'$  = no   General: Weight gain '[ ]'$ ; Weight loss '[ ]'$ ; Anorexia '[ ]'$ ; Fatigue '[ ]'$ ; Fever '[ ]'$ ; Chills '[ ]'$ ; Weakness [ Y]  Cardiac: Chest pain/pressure '[ ]'$ ; Resting SOB '[ ]'$ ; Exertional SOB [ Y]; Orthopnea [Y ]; Pedal Edema '[ ]'$ ; Palpitations '[ ]'$ ; Syncope '[ ]'$ ; Presyncope '[ ]'$ ; Paroxysmal nocturnal dyspnea'[ ]'$   Pulmonary: Cough '[ ]'$ ; Wheezing'[ ]'$ ; Hemoptysis'[ ]'$ ; Sputum '[ ]'$ ; Snoring '[ ]'$   GI: Vomiting'[ ]'$ ; Dysphagia'[ ]'$ ; Melena'[ ]'$ ; Hematochezia '[ ]'$ ; Heartburn'[ ]'$ ; Abdominal pain '[ ]'$ ; Constipation '[ ]'$ ; Diarrhea '[ ]'$ ; BRBPR '[ ]'$   GU: Hematuria'[ ]'$ ; Dysuria  '[ ]'$ ; Nocturia'[ ]'$   Vascular: Pain in legs with walking '[ ]'$ ; Pain in feet with lying flat '[ ]'$ ; Non-healing sores '[ ]'$ ; Stroke '[ ]'$ ; TIA '[ ]'$ ; Slurred speech '[ ]'$ ;  Neuro: Headaches'[ ]'$ ; Vertigo'[ ]'$ ; Seizures'[ ]'$ ; Paresthesias'[ ]'$ ;Blurred vision '[ ]'$ ; Diplopia '[ ]'$ ; Vision changes '[ ]'$   Ortho/Skin: Arthritis '[ ]'$ ; Joint pain [ Y]; Muscle pain '[ ]'$ ; Joint swelling '[ ]'$ ; Back Pain '[ ]'$ ; Rash '[ ]'$   Psych: Depression'[ ]'$ ; Anxiety'[ ]'$   Heme: Bleeding problems '[ ]'$ ; Clotting disorders '[ ]'$ ; Anemia [ Y]  Endocrine: Diabetes '[ ]'$ ; Thyroid dysfunction'[ ]'$   Home Medications Prior to Admission medications   Medication Sig Start Date End Date Taking? Authorizing Provider  bisoprolol-hydrochlorothiazide (ZIAC) 2.5-6.25 MG tablet Take 1 tablet by mouth daily.   Yes [provider]  dapagliflozin propanediol (FARXIGA) 5 MG TABS tablet Take 5 mg by mouth daily.   Yes [provider]  ergocalciferol (VITAMIN D2) 1.25 MG (50000 UT) capsule Take 50,000 Units by mouth every Monday.   Yes [provider]  Ferric Maltol 30 MG CAPS Take 30 mg by mouth daily. Accrufer 30 mg.   Yes [provider]  furosemide (LASIX) 20 MG tablet Take 20 mg by mouth daily.   Yes [provider]  levocetirizine (XYZAL) 5 MG tablet Take 5 mg by mouth daily.  Yes [provider]  Multiple Vitamins-Minerals (EMERGEN-C IMMUNE PO) Take 1 packet by mouth daily.   Yes [provider]  pantoprazole (PROTONIX) 40 MG tablet Take 40 mg by mouth 2 (two) times daily.   Yes [provider]  sacubitril-valsartan (ENTRESTO) 24-26 MG Take 1 tablet by mouth 2 (two) times daily.   Yes [provider]    Past Medical History: History reviewed. No pertinent past medical history.  Past Surgical History: Past Surgical History:  Procedure Laterality Date   BUBBLE STUDY  09/02/2021   Procedure: BUBBLE STUDY;  Surgeon: Pixie Casino, MD;  Location: [facility];  Service: Cardiovascular;;   IR CT  HEAD LTD  08/30/2021   IR PERCUTANEOUS ART THROMBECTOMY/INFUSION INTRACRANIAL INC DIAG ANGIO  08/30/2021   IR US GUIDE VASC ACCESS RIGHT  08/30/2021   RADIOLOGY WITH ANESTHESIA Right 08/30/2021   Procedure: IR WITH ANESTHESIA;  Surgeon: Radiologist, Medication, MD;  Location: [facility];  Service: Radiology;  Laterality: Right;   RIGHT/LEFT HEART CATH AND CORONARY ANGIOGRAPHY N/A 09/02/2021   Procedure: RIGHT/LEFT HEART CATH AND CORONARY ANGIOGRAPHY;  Surgeon: Jettie Booze, MD;  Location: [facility];  Service: Cardiovascular;  Laterality: N/A;   TEE WITHOUT CARDIOVERSION N/A 09/02/2021   Procedure: TRANSESOPHAGEAL ECHOCARDIOGRAM (TEE);  Surgeon: Pixie Casino, MD;  Location: [facility];  Service: Cardiovascular;  Laterality: N/A;    Family History: Father: Deceased HTN/Alzheimers Mother : HTN Sister: HTN  Maternal Aunt: HTN,  Maternal GM: HTN Maternal Uncle: HTN Paternal Grandmother : Diabetes Paternal Uncle: Diabeties.   Social History: Social History   Socioeconomic History   Marital status: Single    Spouse name: Not on file   Number of children: Not on file   Years of education: Not on file   Highest education level: Not on file  Occupational History   Not on file  Tobacco Use   Smoking status: Some Days    Types: Cigars   Smokeless tobacco: Never  Vaping Use   Vaping Use: Never used  Substance and Sexual Activity   Alcohol use: Yes    Comment: drinks socially   Drug use: Never   Sexual activity: Not Currently  Other Topics Concern   Not on file  Social History Narrative   Not on file   Social Determinants of Health   Financial Resource Strain: Not on file  Food Insecurity: Not on file  Transportation Needs: Not on file  Physical Activity: Not on file  Stress: Not on file  Social Connections: Not on file    Allergies:  No Known Allergies  Objective:    Vital Signs:   Temp:  [97.1 F (36.2 C)-98.6 F (37 C)] 98.5 F (36.9 C) (06/23  0758) Pulse Rate:  [0-110] 108 (06/23 0758) Resp:  [14-33] 16 (06/23 0758) BP: (103-137)/(77-104) 119/83 (06/23 0758) SpO2:  [76 %-100 %] 98 % (06/23 0758) Weight:  [84 kg] 84 kg (06/22 1043) Last BM Date : 09/02/21  Weight change: Filed Weights   08/30/21 0000 09/02/21 1043  Weight: 84 kg 84 kg    Intake/Output:  No intake or output data in the 24 hours ending 09/03/21 0944    Physical Exam    General:  Sitting in the chair. No resp difficulty HEENT: normal Neck: supple. JVP 7-8 Carotids 2+ bilat; no bruits. No lymphadenopathy or thyromegaly appreciated. Cor: PMI nondisplaced. Tachy Regular rate & rhythm. No rubs, gallops or murmurs. Lungs: clear Abdomen: soft, nontender, nondistended. No hepatosplenomegaly. No  bruits or masses. Good bowel sounds. Extremities: no cyanosis, clubbing, rash, edema Neuro: alert & orientedx3, cranial nerves grossly intact. moves all 4 extremities w/o difficulty. Affect pleasant   Telemetry   ST 100s narrow QRS   EKG    Admission   Labs   Basic Metabolic Panel: Recent Labs  Lab 08/30/21 0045 08/30/21 0050 08/30/21 0443 09/01/21 0141 09/02/21 0139 09/02/21 1426 09/02/21 1432 09/02/21 1433  NA 141 143  --  141 141 142 142 142  K 2.9* 2.8*  --  3.6 3.8 3.4* 3.5 3.4*  CL 107 104  --  111 110  --   --   --   CO2 24  --   --  26 24  --   --   --   GLUCOSE 131* 131*  --  112* 110*  --   --   --   BUN 22* 21*  --  13 11  --   --   --   CREATININE 1.27* 1.30*  --  1.04* 1.00  --   --   --   CALCIUM 8.4*  --   --  8.4* 8.5*  --   --   --   MG  --   --  2.0  --   --   --   --   --     Liver Function Tests: Recent Labs  Lab 08/30/21 0045  AST 24  ALT 77*  ALKPHOS 74  BILITOT 0.5  PROT 5.3*  ALBUMIN 3.0*   No results for input(s): "LIPASE", "AMYLASE" in the last 168 hours. No results for input(s): "AMMONIA" in the last 168 hours.  CBC: Recent Labs  Lab 08/30/21 0045 08/30/21 0050 09/02/21 1426 09/02/21 1432  09/02/21 1433  WBC 5.3  --   --   --   --   NEUTROABS 3.2  --   --   --   --   HGB 12.7 12.9 11.6* 11.9* 11.6*  HCT 37.6 38.0 34.0* 35.0* 34.0*  MCV 98.4  --   --   --   --   PLT 150  --   --   --   --     Cardiac Enzymes: No results for input(s): "CKTOTAL", "CKMB", "CKMBINDEX", "TROPONINI" in the last 168 hours.  BNP: BNP (last 3 results) Recent Labs    08/30/21 0045 09/01/21 0141  BNP 999.9* 1,548.4*    ProBNP (last 3 results) No results for input(s): "PROBNP" in the last 8760 hours.   CBG: Recent Labs  Lab 09/02/21 0623 09/02/21 1514 09/02/21 1945 09/02/21 2109 09/03/21 0632  GLUCAP 115* 88 120* 93 112*    Coagulation Studies: No results for input(s): "LABPROT", "INR" in the last 72 hours.   Imaging   CARDIAC CATHETERIZATION  Result Date: 09/02/2021   There is severe left ventricular systolic dysfunction.   LV end diastolic pressure is moderately elevated.  LVEDP 33 mmHg.   The left ventricular ejection fraction is less than 25% by visual estimate.   Hemodynamic findings consistent with moderate pulmonary hypertension.   There is no aortic valve stenosis.   Aortic saturation 94%, PA saturation 62%, PA pressure 53/32, mean PA pressure 41 mmHg, mean pulmonary capillary wedge pressure 33 mmHg, cardiac output 4.61 L/min, cardiac index 2.41. No angiographically apparent coronary artery disease.  Elevated right heart pressures.  She is volume overloaded.  Continue with plans for diuresis.   ECHO TEE  Result Date: 09/02/2021    TRANSESOPHOGEAL ECHO REPORT  Patient Name:   KYNZLEY DOWSON Date of Exam: 09/02/2021 Medical Rec #:  [id number]   Height:       65.0 in Accession #:    [id number]  Weight:       185.2 lb Date of Birth:  December 03, 1970   BSA:          1.914 m Patient Age:    48 years    BP:           129/85 mmHg Patient Gender: F           HR:           109 bpm. Exam Location:  Inpatient Procedure: Transesophageal Echo, Cardiac Doppler and Color Doppler Indications:      Stroke  History:         Patient has prior history of Echocardiogram examinations, most                  recent 08/30/2021. Abnormal ECG; Stroke.  Sonographer:     Roseanna Rainbow RDCS Referring Phys:  [place] Diagnosing Phys: Lyman Bishop MD PROCEDURE: After discussion of the risks and benefits of a TEE, an informed consent was obtained from the patient. The transesophogeal probe was passed without difficulty through the esophogus of the patient. Imaged were obtained with the patient in a supine position. Sedation performed by different physician. The patient was monitored while under deep sedation. Anesthestetic sedation was provided intravenously by Anesthesiology: '20mg'$  of Propofol. The patient's vital signs; including heart rate, blood  pressure, and oxygen saturation; remained stable throughout the procedure. The patient developed no complications during the procedure. IMPRESSIONS  1. Left ventricular ejection fraction, by estimation, is <20%. The left ventricle has severely decreased function.  2. Right ventricular systolic function is normal. The right ventricular size is normal.  3. Left atrial size was moderately dilated. No left atrial/left atrial appendage thrombus was detected.  4. Right atrial size was mildly dilated.  5. The mitral valve is abnormal. Mild to moderate mitral valve regurgitation.  6. The aortic valve is tricuspid. Aortic valve regurgitation is not visualized.  7. Agitated saline contrast bubble study was negative, with no evidence of any interatrial shunt. Conclusion(s)/Recommendation(s): No LA/LAA thrombus identified. Negative bubble study for interatrial shunt. No intracardiac source of embolism detected on this on this transesophageal echocardiogram. FINDINGS  Left Ventricle: Left ventricular ejection fraction, by estimation, is <20%. The left ventricle has severely decreased function. The left ventricular internal cavity size was normal in size. Right Ventricle: The  right ventricular size is normal. No increase in right ventricular wall thickness. Right ventricular systolic function is normal. Left Atrium: Left atrial size was moderately dilated. No left atrial/left atrial appendage thrombus was detected. Right Atrium: Right atrial size was mildly dilated. Pericardium: There is no evidence of pericardial effusion. Mitral Valve: The mitral valve is abnormal. Mild to moderate mitral valve regurgitation, with centrally-directed jet. Tricuspid Valve: The tricuspid valve is grossly normal. Tricuspid valve regurgitation is mild. Aortic Valve: The aortic valve is tricuspid. Aortic valve regurgitation is not visualized. Pulmonic Valve: The pulmonic valve was grossly normal. Pulmonic valve regurgitation is trivial. Aorta: The aortic root and ascending aorta are structurally normal, with no evidence of dilitation. IAS/Shunts: No atrial level shunt detected by color flow Doppler. Agitated saline contrast was given intravenously to evaluate for intracardiac shunting. Agitated saline contrast bubble study was negative, with no evidence of any interatrial shunt. 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  Admitted with an acute stroke and new acute HFrEF.    Assessment/Plan   1. Acute CVA LMCA Infarct - CTA CTA of head  and neck: Occlusion of the proximal left M2, with possible distal reconstitution but relatively poor perfusion in the left MCA territory. No other intracranial large vessel occlusion or hemodynamically significant stenosis. No hemodynamically significant stenosis in the neck. Cerebral angio   Post IR CT Left M2/MCA posterior division branch occlusion extending into the distal M1. Mechanical thrombectomy performed with direct contact aspiration.  CTA of head and neck: Occlusion of the proximal left M2, with possible distal reconstitution but relatively poor perfusion in the left MCA territory. No other intracranial large vessel occlusion or hemodynamically significant stenosis. No hemodynamically significant stenosis in the neck. MR- patchy left hemispheric infarcts involving frontal lobes ACA/MCA watershed      Carotid Ultrasound : bilateral minimal plaque/intimal thcikening TEE- No PFO/thrombus but with smoke in LV. Concerning for thrombus with reduced EF ~20%  Suspect cardioembolic with reduced EF.  On [place] stroke  trial. Anticoagulants per Dr Sethi/Dr Kenyona Rena.  PT/OT following.   2. Acute HFrEF, NICM  -Echo EF 20-25% RV stable.  -Cath with normal , elevated filling pressures, and preserved cardiac output.  - TEE - No PFO/thrombus but with smoke in LV. Concerning for thrombus with reduced EF ~20%  - Will obtain CMRI Monday to further assess.  - HIV NR. Check TSH.  - NYHA III. Appears warm/wet - Needs additional diuresis today. Continue IV lasix today.  - In sinus tach today. Add 0.125 mg digoxin - Hold off on bb/ARNI - Add spironolactone 12.5 mg daily  - Follow daily BMET  - Daily weight, strict I/Os, and low sodium diet.   3. Sinus Tach -Check TSH -Adding dig as noted above.  - Consider adding bb soon.   4. H/O SVT with ablation -Had ablation 2015 at [facility]   5. Tobacco Abuse Discussed cessation.   Length of Stay: 4  Amy Clegg, NP  09/03/2021, 9:44  AM  Advanced Heart Failure Team Pager [phone] (M-F; 7a - 5p)  Please contact [facility] for night-coverage after hours (4p -7a ) and weekends on [email or web address]  Patient seen and examined with the above-signed Advanced Practice Provider and/or Housestaff. I personally reviewed laboratory data, imaging studies and relevant notes. I independently examined the patient and formulated the important aspects of the plan. I have edited the note to reflect any of my changes or salient points. I have personally discussed the plan with the patient and/or family.   51 y/o woman with h/o SVT s/p ablation, moderate ETOH/tobacco use. Admitted with acute R sided CVA. Underwent thrombectomy. EF found to  be 20-25%. TEE no obvious thrombus + smoke in left atrium. Cath no CAD. Preserved CO with elevated filling pressures.   Aphasia and RUE weakness are improving. Remains tachycardic  General:  Well appearing. No resp difficulty HEENT: normal Neck: supple. no JVD. Carotids 2+ bilat; no bruits. No lymphadenopathy or thryomegaly appreciated. Cor: PMI nondisplaced. Regular tachy . No rubs, gallops or murmurs. Lungs: clear Abdomen: soft, nontender, nondistended. No hepatosplenomegaly. No bruits or masses. Good bowel sounds. Extremities: no cyanosis, clubbing, rash, edema Neuro: alert & orientedx3, cranial nerves grossly intact. Mild RUE weakness and mild expressive aphasia   Suspect she had cardio-embolic CVA in setting of very low EF and stasis. Remains volume overloaded and tachycardic. Etiology of cardiomyopathy remains unclear. Will plan cardiac MRI. Start IV diuresis and begin aggressive titration of cMRI. Given likely cardioembolic event would favor full anticoagulation as soon as possible when safe from stroke standpoint.  D/w Dr. Janie Morning, MD  5:12 PM

## 2021-09-03 NOTE — Progress Notes (Addendum)
Progress Note  Patient Name: Gina Nichols Date of Encounter: 09/03/2021  [facility] Cardiologist: None   Subjective   Shortness of breath today. Worsened when lying in bed. No pain.   Inpatient Medications    Scheduled Meds:  atorvastatin  40 mg Oral Daily   insulin aspart  0-15 Units Subcutaneous TID WC   sodium chloride flush  3 mL Intravenous Q12H   OCEANIC-STROKE asundexian or placebo  1 tablet Oral Daily   Continuous Infusions:  sodium chloride     lactated ringers     PRN Meds: sodium chloride, acetaminophen **OR** acetaminophen (TYLENOL) oral liquid 160 mg/5 mL **OR** acetaminophen, acetaminophen, albuterol, ondansetron (ZOFRAN) IV, phenol, senna-docusate, sodium chloride flush   Vital Signs    Vitals:   09/02/21 2044 09/02/21 2338 09/03/21 0354 09/03/21 0758  BP:  108/89 104/77 119/83  Pulse: (!) 106 (!) 105 (!) 107 (!) 108  Resp: '17 14 18 16  '$ Temp:  98.6 F (37 C) 98.6 F (37 C) 98.5 F (36.9 C)  TempSrc:  Axillary Axillary Oral  SpO2: 98% 100%  98%  Weight:      Height:       No intake or output data in the 24 hours ending 09/03/21 0839     09/02/2021   10:43 AM 08/30/2021   12:00 AM  Last 3 Weights  Weight (lbs) 185 lb 3 oz 185 lb 3 oz  Weight (kg) 84 kg 84 kg      Telemetry    Sinus tach- Personally Reviewed  ECG    No am ekg - Personally Reviewed  Physical Exam   General: Well developed, well nourished, NAD  HEENT: OP clear, mucus membranes moist  SKIN: warm, dry. No rashes. Neuro: No focal deficits  Musculoskeletal: Muscle strength 5/5 all ext  Psychiatric: Mood and affect normal  Neck: + JVD Lungs: Basilar crackles.  Cardiovascular: Regular rate and rhythm. No murmurs, gallops or rubs. Abdomen:Soft. Bowel sounds present. Non-tender.  Extremities: No lower extremity edema  Labs    High Sensitivity Troponin:  No results for input(s): "TROPONINIHS" in the last 720 hours.   Chemistry Recent Labs  Lab 08/30/21 0045  08/30/21 0050 08/30/21 0443 09/01/21 0141 09/02/21 0139 09/02/21 1426 09/02/21 1432 09/02/21 1433  NA 141 143  --  141 141 142 142 142  K 2.9* 2.8*  --  3.6 3.8 3.4* 3.5 3.4*  CL 107 104  --  111 110  --   --   --   CO2 24  --   --  26 24  --   --   --   GLUCOSE 131* 131*  --  112* 110*  --   --   --   BUN 22* 21*  --  13 11  --   --   --   CREATININE 1.27* 1.30*  --  1.04* 1.00  --   --   --   CALCIUM 8.4*  --   --  8.4* 8.5*  --   --   --   MG  --   --  2.0  --   --   --   --   --   PROT 5.3*  --   --   --   --   --   --   --   ALBUMIN 3.0*  --   --   --   --   --   --   --   AST 24  --   --   --   --   --   --   --  ALT 77*  --   --   --   --   --   --   --   ALKPHOS 74  --   --   --   --   --   --   --   BILITOT 0.5  --   --   --   --   --   --   --   GFRNONAA 51*  --   --  >60 >60  --   --   --   ANIONGAP 10  --   --  4* 7  --   --   --     Lipids  Recent Labs  Lab 08/30/21 0443  CHOL 190  TRIG 41  HDL 59  LDLCALC 123*  CHOLHDL 3.2    Hematology Recent Labs  Lab 08/30/21 0045 08/30/21 0050 09/02/21 1426 09/02/21 1432 09/02/21 1433  WBC 5.3  --   --   --   --   RBC 3.82*  --   --   --   --   HGB 12.7   < > 11.6* 11.9* 11.6*  HCT 37.6   < > 34.0* 35.0* 34.0*  MCV 98.4  --   --   --   --   MCH 33.2  --   --   --   --   MCHC 33.8  --   --   --   --   RDW 16.0*  --   --   --   --   PLT 150  --   --   --   --    < > = values in this interval not displayed.   Thyroid No results for input(s): "TSH", "FREET4" in the last 168 hours.  BNP Recent Labs  Lab 08/30/21 0045 09/01/21 0141  BNP 999.9* 1,548.4*    DDimer No results for input(s): "DDIMER" in the last 168 hours.   Radiology    CARDIAC CATHETERIZATION  Result Date: 09/02/2021   There is severe left ventricular systolic dysfunction.   LV end diastolic pressure is moderately elevated.  LVEDP 33 mmHg.   The left ventricular ejection fraction is less than 25% by visual estimate.   Hemodynamic findings  consistent with moderate pulmonary hypertension.   There is no aortic valve stenosis.   Aortic saturation 94%, PA saturation 62%, PA pressure 53/32, mean PA pressure 41 mmHg, mean pulmonary capillary wedge pressure 33 mmHg, cardiac output 4.61 L/min, cardiac index 2.41. No angiographically apparent coronary artery disease.  Elevated right heart pressures.  She is volume overloaded.  Continue with plans for diuresis.   ECHO TEE  Result Date: 09/02/2021    TRANSESOPHOGEAL ECHO REPORT   Patient Name:   Gina Nichols Date of Exam: 09/02/2021 Medical Rec #:  [id number]   Height:       65.0 in Accession #:    [id number]  Weight:       185.2 lb Date of Birth:  1970-12-25   BSA:          1.914 m Patient Age:    51 years    BP:           129/85 mmHg Patient Gender: F           HR:           109 bpm. Exam Location:  Inpatient Procedure: Transesophageal Echo, Cardiac Doppler and Color Doppler Indications:     Stroke  History:  Patient has prior history of Echocardiogram examinations, most                  recent 08/30/2021. Abnormal ECG; Stroke.  Sonographer:     Roseanna Rainbow RDCS Referring Phys:  [place] Diagnosing Phys: Lyman Bishop MD PROCEDURE: After discussion of the risks and benefits of a TEE, an informed consent was obtained from the patient. The transesophogeal probe was passed without difficulty through the esophogus of the patient. Imaged were obtained with the patient in a supine position. Sedation performed by different physician. The patient was monitored while under deep sedation. Anesthestetic sedation was provided intravenously by Anesthesiology: '20mg'$  of Propofol. The patient's vital signs; including heart rate, blood  pressure, and oxygen saturation; remained stable throughout the procedure. The patient developed no complications during the procedure. IMPRESSIONS  1. Left ventricular ejection fraction, by estimation, is <20%. The left ventricle has severely decreased function.  2.  Right ventricular systolic function is normal. The right ventricular size is normal.  3. Left atrial size was moderately dilated. No left atrial/left atrial appendage thrombus was detected.  4. Right atrial size was mildly dilated.  5. The mitral valve is abnormal. Mild to moderate mitral valve regurgitation.  6. The aortic valve is tricuspid. Aortic valve regurgitation is not visualized.  7. Agitated saline contrast bubble study was negative, with no evidence of any interatrial shunt. Conclusion(s)/Recommendation(s): No LA/LAA thrombus identified. Negative bubble study for interatrial shunt. No intracardiac source of embolism detected on this on this transesophageal echocardiogram. FINDINGS  Left Ventricle: Left ventricular ejection fraction, by estimation, is <20%. The left ventricle has severely decreased function. The left ventricular internal cavity size was normal in size. Right Ventricle: The right ventricular size is normal. No increase in right ventricular wall thickness. Right ventricular systolic function is normal. Left Atrium: Left atrial size was moderately dilated. No left atrial/left atrial appendage thrombus was detected. Right Atrium: Right atrial size was mildly dilated. Pericardium: There is no evidence of pericardial effusion. Mitral Valve: The mitral valve is abnormal. Mild to moderate mitral valve regurgitation, with centrally-directed jet. Tricuspid Valve: The tricuspid valve is grossly normal. Tricuspid valve regurgitation is mild. Aortic Valve: The aortic valve is tricuspid. Aortic valve regurgitation is not visualized. Pulmonic Valve: The pulmonic valve was grossly normal. Pulmonic valve regurgitation is trivial. Aorta: The aortic root and ascending aorta are structurally normal, with no evidence of dilitation. IAS/Shunts: No atrial level shunt detected by color flow Doppler. Agitated saline contrast was given intravenously to evaluate for intracardiac shunting. Agitated saline contrast  bubble study was negative, with no evidence of any interatrial shunt. Lyman Bishop MD Electronically signed by Lyman Bishop MD Signature Date/Time: 09/02/2021/2:23:24 PM    Final    VAS US CAROTID  Result Date: 09/02/2021 Carotid Arterial Duplex Study Patient Name:  Gina Nichols  Date of Exam:   09/01/2021 Medical Rec #: [id number]    Accession #:    [id number] Date of Birth: [birth year]    Patient Gender: F Patient Age:   51 years Exam Location:  [facility] Procedure:      VAS US CAROTID Referring Phys: PRAMOD SETHI --------------------------------------------------------------------------------  Indications:       CVA. Risk Factors:      Hypertension, hyperlipidemia. Comparison Study:  No previous exams Performing Technologist: Jody Hill RVT, RDMS  Examination Guidelines: A complete evaluation includes B-mode imaging, spectral Doppler, color Doppler, and power Doppler as needed of all accessible portions of each vessel. Bilateral  testing is considered an integral part of a complete examination. Limited examinations for reoccurring indications may be performed as noted.  Right Carotid Findings: +----------+--------+--------+--------+------------------+--------+           PSV cm/sEDV cm/sStenosisPlaque DescriptionComments +----------+--------+--------+--------+------------------+--------+ CCA Prox  46      9                                          +----------+--------+--------+--------+------------------+--------+ CCA Distal36      18                                         +----------+--------+--------+--------+------------------+--------+ ICA Prox  26      13                                         +----------+--------+--------+--------+------------------+--------+ ICA Distal28      14                                         +----------+--------+--------+--------+------------------+--------+ ECA       49      16                                          +----------+--------+--------+--------+------------------+--------+ +----------+--------+-------+----------------+-------------------+           PSV cm/sEDV cmsDescribe        Arm Pressure (mmHG) +----------+--------+-------+----------------+-------------------+ [id number]             Multiphasic, WNL                    +----------+--------+-------+----------------+-------------------+ +---------+--------+--+--------+-+---------+ VertebralPSV cm/s19EDV cm/s7Antegrade +---------+--------+--+--------+-+---------+  Left Carotid Findings: +----------+--------+--------+--------+------------------+--------+           PSV cm/sEDV cm/sStenosisPlaque DescriptionComments +----------+--------+--------+--------+------------------+--------+ CCA Prox  58      17                                         +----------+--------+--------+--------+------------------+--------+ CCA Distal45      15                                         +----------+--------+--------+--------+------------------+--------+ ICA Prox  35      16                                         +----------+--------+--------+--------+------------------+--------+ ICA Distal42      18                                         +----------+--------+--------+--------+------------------+--------+ ECA       55      15                                         +----------+--------+--------+--------+------------------+--------+ +----------+--------+--------+----------------+-------------------+  PSV cm/sEDV cm/sDescribe        Arm Pressure (mmHG) +----------+--------+--------+----------------+-------------------+ [id number]              Multiphasic, WNL                    +----------+--------+--------+----------------+-------------------+ +---------+--------+--+--------+--+---------+ VertebralPSV cm/s27EDV cm/s13Antegrade +---------+--------+--+--------+--+---------+   Summary: Right Carotid:  The extracranial vessels were near-normal with only minimal wall                thickening or plaque. Incidental: Multiple nodes visualized in                the right thyroid- one simple, cystic measuring 2.39 x 1.70 x                2.37 cm; one heterogenous, hypoechoic, with peripheral                hypervascularity, and measuring 1.8 x 1.3 x 1.4 cm (AP > width).                Dedicated thyroid imaging recommended. Left Carotid: The extracranial vessels were near-normal with only minimal wall               thickening or plaque. Vertebrals:  Bilateral vertebral arteries demonstrate antegrade flow. Subclavians: Normal flow hemodynamics were seen in bilateral subclavian              arteries. *See table(s) above for measurements and observations.  Electronically signed by Antony Contras MD on 09/02/2021 at 11:39:26 AM.    Final     Cardiac Studies     Patient Profile     51 y.o. female with no known cardiac disease admitted with a stroke. TTE with global hypokinesis with LVEF=20-25%. She reports resting chest pressure and dyspnea when lying down over past two months.  Assessment & Plan     Non ischemic Cardiomyopathy/Acute systolic CHF: Unclear etiology or duration of her LV dysfunction. No prior echo. Cardiac cath 09/02/21 with no evidence of CAD. PCWP of 33 mmHg. Cardiac index 2.4. TEE 09/02/21 with LVEF less than 20%, no PFO/ASD and no obvious intracardiac thrombus. We have not started a beta blocker or ARB yet secondary to need for permissive HTN over the past few days post CVA.  I think we can start slowly adding in GDMT therapy today. IV Lasix 40 mg BID. Follow I/O. I will ask the Advanced Heart Failure team to see her today and we can decide on how to slowly add in medical therapy.   CVA: Improving daily. No obvious intracardiac thrombus on TEE although given her severe LV systolic dysfunction, she may benefit from long term anti-coagulation.    For questions or updates, please contact [facility] Please consult [email or web address] for contact info under        Signed, Lauree Chandler, MD  09/03/2021, 8:39 AM

## 2021-09-03 NOTE — Progress Notes (Signed)
Occupational Therapy Treatment Patient Details Name: Gina Nichols MRN: [id number] DOB: 1970-05-14 Today's Date: 09/03/2021   History of present illness 51 y.o. female who presented with acute onset of right sided weakness, facial droop and aphasia. 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 CTA 6/19: Occlusion of the proximal left M2, with possible distal reconstitution but relatively poor perfusion in the left MCA territory. Pt s/p mechanical thrombectomy 6/19.   OT comments  Focused session on executive functioning through IADLs (medication management). Administering Pill Box Test. Pt failed the assessment, demonstrating poor mental flexibility, suboptimal search strategies, concrete thinking and the inability to multitask. Pt had a total of _14_errors, where more than 3 errors is considered a fail.  Errors: One tablet 3x/day (yellow) - _14_ errors (misplacement) One tablet 2x/day with breakfast and dinner (green) - _0_ errors One tablet in the morning (Blue)- _0_ errors One tablet daily at bedtime (orange) - _0_ errors One tablet every other day (red) - _0_ errors   Number of misplaced movement errors (pills placed in incorrect compartment)- _14_ Number of total errors (sum of omissions; misplacements) - _14_ Total time to complete task (allowed 5 min) - _4_ min and 58 sec   Discussing test results and pt demonstrating increasing awareness stating "I don't feel like I did this one correctly" pointing to the one table 3x/day bottle. With Mod cues, pt able to recognize error and correct. Recommending pt have mother assist with IADLs including medication management, bill paying, etc.  Continue to recommend dc to OP neuro for OT to address  executive functioning and IADLs.    Recommendations for follow up therapy are one component of a multi-disciplinary discharge planning process, led by the attending physician.  Recommendations may be updated based on patient status, additional functional criteria and insurance authorization.    Follow Up Recommendations  Outpatient OT (for cognition)    Assistance Recommended at Discharge Frequent or constant Supervision/Assistance  Patient can return home with the following  Assistance with cooking/housework;Direct supervision/assist for medications management;Direct supervision/assist for financial management   Equipment Recommendations  None recommended by OT    Recommendations for Other Services      Precautions / Restrictions Precautions Precautions: Fall Restrictions Weight Bearing Restrictions: No       Mobility Bed Mobility               General bed mobility comments: Sitting in recliner upon arrival    Transfers                         Balance Overall balance assessment: Needs assistance Sitting-balance support: Feet supported, No upper extremity supported Sitting balance-Leahy Scale: Good                                     ADL either performed or assessed with clinical judgement   ADL Overall ADL's : Needs assistance/impaired                                       General ADL Comments: Focused session on executive functioning through IADLs - administering  pill box test    Extremity/Trunk Assessment Upper Extremity Assessment Upper Extremity Assessment: RUE deficits/detail;LUE deficits/detail RUE Deficits / Details: AROM and strength WFL some slowed coordination LUE Deficits / Details: AROM and strength WFL some slowed coordination   Lower Extremity Assessment Lower Extremity Assessment: Defer to PT evaluation RLE Deficits / Details: AROM and strength WFL, some numbness lateral aspect of foot from prior  back issues per pt RLE Sensation: decreased light touch LLE Deficits / Details: AROM and strength WFL        Vision   Vision Assessment?: Vision impaired- to be further tested in functional context   Perception     Praxis      Cognition Arousal/Alertness: Awake/alert Behavior During Therapy: WFL for tasks assessed/performed Overall Cognitive Status: Impaired/Different from baseline Area of Impairment: Attention, Memory, Following commands, Safety/judgement, Awareness, Problem solving                   Current Attention Level: Selective Memory: Decreased short-term memory Following Commands: Follows one step commands consistently   Awareness: Emergent Problem Solving: Slow processing General Comments: Administering the Pill Box Test. Pt scoring 14 errors and completed in 4 min and 58 sec. Upon review, pt verbalizing "I just don't feel like I did this one right" pointing to the three times daily bottle. With Mod cues, pt able to recognize the error and correct it. Discussing difficulty with executive functioning and following directions.        Exercises      Shoulder Instructions       General Comments      Pertinent Vitals/ Pain       Pain Assessment Pain Assessment: No/denies pain  Home Living                                          Prior Functioning/Environment              Frequency  Min 2X/week        Progress Toward Goals  OT Goals(current goals can now be found in the care plan section)  Progress towards OT goals: Progressing toward goals  Acute Rehab OT Goals OT Goal Formulation: With patient Time For Goal Achievement: 09/14/21 Potential to Achieve Goals: Good ADL Goals Pt Will Perform Grooming: Independently Pt Will Perform Lower Body Dressing: Independently;sit to/from stand Pt Will Transfer to Toilet: Independently;ambulating Additional ADL Goal #1: Pt will indep complete 3 step trial making task as a  precursor to IADLs Additional ADL Goal #2: Pt will indep complete a medication management IADL task  Plan Discharge plan remains appropriate    Co-evaluation                 AM-PAC OT "6 Clicks" Daily Activity     Outcome Measure   Help from another person eating meals?: None Help from another person taking care of personal grooming?: A Little Help from another person toileting, which includes using toliet, bedpan, or urinal?: A Little Help from another person bathing (including washing, rinsing, drying)?: A Little Help from another person to put on and taking off regular upper body clothing?: None Help from another person to put on and taking off regular lower body clothing?: A Little 6 Click Score: 20    End of Session    OT Visit Diagnosis: Unsteadiness on feet (R26.81);Muscle weakness (generalized) (M62.81);Pain   Activity Tolerance Patient  tolerated treatment well   Patient Left with call bell/phone within reach;in chair   Nurse Communication Mobility status        Time: 1595-3967 OT Time Calculation (min): 22 min  Charges: OT General Charges $OT Visit: 1 Visit OT Treatments $Cognitive Funtion inital: Initial 15 mins $Cognitive Funtion additional: Additional15 mins  Tahani Potier MSOT, OTR/L Acute Rehab Office: [place] 09/03/2021, 2:31 PM

## 2021-09-03 NOTE — Progress Notes (Addendum)
STROKE TEAM PROGRESS NOTE   INTERVAL HISTORY No acute events.  No family is at the bedside.    Blood pressure adequately controlled.  Vital signs stable. Neurologically stable. She denies any new concerns. Reports she understands plan for IPR. She was able to relate cardiology plan of care.  Cardiac catheterization yesterday showed no evidence of coronary artery disease.  Plan to consult advanced heart failure team today Vitals:   09/02/21 2044 09/02/21 2338 09/03/21 0354 09/03/21 0758  BP:  108/89 104/77 119/83  Pulse: (!) 106 (!) 105 (!) 107 (!) 108  Resp: '17 14 18 16  '$ Temp:  98.6 F (37 C) 98.6 F (37 C) 98.5 F (36.9 C)  TempSrc:  Axillary Axillary Oral  SpO2: 98% 100%  98%  Weight:      Height:       CBC:  Recent Labs  Lab 08/30/21 0045 08/30/21 0050 09/02/21 1432 09/02/21 1433  WBC 5.3  --   --   --   NEUTROABS 3.2  --   --   --   HGB 12.7   < > 11.9* 11.6*  HCT 37.6   < > 35.0* 34.0*  MCV 98.4  --   --   --   PLT 150  --   --   --    < > = values in this interval not displayed.   Basic Metabolic Panel:  Recent Labs  Lab 08/30/21 0443 09/01/21 0141 09/02/21 0139 09/02/21 1426 09/02/21 1432 09/02/21 1433  NA  --  141 141   < > 142 142  K  --  3.6 3.8   < > 3.5 3.4*  CL  --  111 110  --   --   --   CO2  --  26 24  --   --   --   GLUCOSE  --  112* 110*  --   --   --   BUN  --  13 11  --   --   --   CREATININE  --  1.04* 1.00  --   --   --   CALCIUM  --  8.4* 8.5*  --   --   --   MG 2.0  --   --   --   --   --    < > = values in this interval not displayed.    Lipid Panel:  Recent Labs  Lab 08/30/21 0443  CHOL 190  TRIG 41  HDL 59  CHOLHDL 3.2  VLDL 8  LDLCALC 123*    HgbA1c:  Recent Labs  Lab 08/30/21 0443  HGBA1C 5.3   Urine Drug Screen: No results for input(s): "LABOPIA", "COCAINSCRNUR", "LABBENZ", "AMPHETMU", "THCU", "LABBARB" in the last 168 hours.  Alcohol Level  Recent Labs  Lab 08/30/21 0045  ETH <10    IMAGING past 24  hours CARDIAC CATHETERIZATION  Result Date: 09/02/2021   There is severe left ventricular systolic dysfunction.   LV end diastolic pressure is moderately elevated.  LVEDP 33 mmHg.   The left ventricular ejection fraction is less than 25% by visual estimate.   Hemodynamic findings consistent with moderate pulmonary hypertension.   There is no aortic valve stenosis.   Aortic saturation 94%, PA saturation 62%, PA pressure 53/32, mean PA pressure 41 mmHg, mean pulmonary capillary wedge pressure 33 mmHg, cardiac output 4.61 L/min, cardiac index 2.41. No angiographically apparent coronary artery disease.  Elevated right heart pressures.  She is volume overloaded.  Continue with plans for diuresis.   ECHO TEE  Result Date: 09/02/2021    TRANSESOPHOGEAL ECHO REPORT   Patient Name:   SIDRA OLDFIELD Date of Exam: 09/02/2021 Medical Rec #:  [id number]   Height:       65.0 in Accession #:    [id number]  Weight:       185.2 lb Date of Birth:  01-31-1971   BSA:          1.914 m Patient Age:    51 years    BP:           129/85 mmHg Patient Gender: F           HR:           109 bpm. Exam Location:  Inpatient Procedure: Transesophageal Echo, Cardiac Doppler and Color Doppler Indications:     Stroke  History:         Patient has prior history of Echocardiogram examinations, most                  recent 08/30/2021. Abnormal ECG; Stroke.  Sonographer:     Roseanna Rainbow RDCS Referring Phys:  [place] Diagnosing Phys: Lyman Bishop MD PROCEDURE: After discussion of the risks and benefits of a TEE, an informed consent was obtained from the patient. The transesophogeal probe was passed without difficulty through the esophogus of the patient. Imaged were obtained with the patient in a supine position. Sedation performed by different physician. The patient was monitored while under deep sedation. Anesthestetic sedation was provided intravenously by Anesthesiology: '20mg'$  of Propofol. The patient's vital signs; including heart  rate, blood  pressure, and oxygen saturation; remained stable throughout the procedure. The patient developed no complications during the procedure. IMPRESSIONS  1. Left ventricular ejection fraction, by estimation, is <20%. The left ventricle has severely decreased function.  2. Right ventricular systolic function is normal. The right ventricular size is normal.  3. Left atrial size was moderately dilated. No left atrial/left atrial appendage thrombus was detected.  4. Right atrial size was mildly dilated.  5. The mitral valve is abnormal. Mild to moderate mitral valve regurgitation.  6. The aortic valve is tricuspid. Aortic valve regurgitation is not visualized.  7. Agitated saline contrast bubble study was negative, with no evidence of any interatrial shunt. Conclusion(s)/Recommendation(s): No LA/LAA thrombus identified. Negative bubble study for interatrial shunt. No intracardiac source of embolism detected on this on this transesophageal echocardiogram. FINDINGS  Left Ventricle: Left ventricular ejection fraction, by estimation, is <20%. The left ventricle has severely decreased function. The left ventricular internal cavity size was normal in size. Right Ventricle: The right ventricular size is normal. No increase in right ventricular wall thickness. Right ventricular systolic function is normal. Left Atrium: Left atrial size was moderately dilated. No left atrial/left atrial appendage thrombus was detected. Right Atrium: Right atrial size was mildly dilated. Pericardium: There is no evidence of pericardial effusion. Mitral Valve: The mitral valve is abnormal. Mild to moderate mitral valve regurgitation, with centrally-directed jet. Tricuspid Valve: The tricuspid valve is grossly normal. Tricuspid valve regurgitation is mild. Aortic Valve: The aortic valve is tricuspid. Aortic valve regurgitation is not visualized. Pulmonic Valve: The pulmonic valve was grossly normal. Pulmonic valve regurgitation is trivial.  Aorta: The aortic root and ascending aorta are structurally normal, with no evidence of dilitation. IAS/Shunts: No atrial level shunt detected by color flow Doppler. Agitated saline contrast was given intravenously to evaluate for intracardiac shunting. Agitated saline contrast  bubble study was negative, with no evidence of any interatrial shunt. Lyman Bishop MD Electronically signed by Lyman Bishop MD Signature Date/Time: 09/02/2021/2:23:24 PM    Final     PHYSICAL EXAM  Physical Exam  HEENT-  Normocephalic with evidence for prior left craniotomy  Lungs- Respirations unlabored Extremities- No edema   Neurological Examination Mental Status: Awake and alert.  Mild expressive aphasia.  Slight hesitancy in her speech.  Able to follow most verbal commands, most commands no dysarthria Cranial Nerves: II: PERRL at 2 mm. Blinks to threat on the left but not on the right.   III,IV, VI: Right palpebral fissure is wider than on the left. Eyes are conjugate with leftward gaze; able to track about 5 mm past the midline to the right with significant hesitation. No nystagmus.  V: Brisk response to eyelash stimulation on the left. Weak response to eyelash stimulation on the right.  VII: Right facial droop VIII: Hearing intact to voice IX,X: Unable to assess. Gag deferred due to aspiration risk.  XI: Head preferentially rotated to the left  XII: Midline tongue extension Motor: LUE and LLE 5/5 RUE initially 3/5 with right grip weakness and diminished fine motor skills. RLE initially with 3/5 strength normal strength on the left Sensory: Reacts less briskly to pinch and plantar stimulation on the right.  Deep Tendon Reflexes: 2+ and symmetric throughout Plantars: Right: Mute             Left: downgoing Cerebellar: No ataxia with FNF on the left. Unable to perform on the right.  Gait: Unable to assess NIHSS 3 Lab Results:  ASSESSMENT/PLAN Ms. Gina Nichols is a 51 y.o. 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  1. Initial NIHSS 20, now NIHSS 3 2. CT head: Postsurgical changes in the left frontal lobe with encephalomalacia and a small somewhat hyperdense mass along the lateral aspect of the left ventricle/resection cavity, which is indeterminate but could represent residual or recurrent tumor, if the postoperative findings are related to tumor resection. No evidence of acute infarct or hemorrhage. ASPECTS is 10 3. Hypokalemic with potassium of 2.8.  4. CTA of head and neck: Occlusion of the proximal left M2, with possible distal reconstitution but relatively poor perfusion in the left MCA territory. No other intracranial large vessel occlusion or hemodynamically significant stenosis. No hemodynamically significant stenosis in the neck.  5. Stroke risk factors: Cancer history.    7. Patient is now s/p VIR  and is more alert with more fluent speech than initial presentation.    Stroke :  left MCA territory  infarct   likely secondary  left M2 occlusion likely from cryptogenic source versus underlying new diagnosis of cardiomyopathy Code Stroke     CT head: Postsurgical changes in the left frontal lobe with encephalomalacia and a small somewhat hyperdense mass along the lateral aspect of the left ventricle/resection cavity, which is indeterminate but could represent residual or recurrent tumor, if the postoperative findings are related to tumor resection. No evidence of acute infarct or hemorrhage. ASPECTS is 10  CTA of head and neck: Occlusion of the proximal left M2, with possible distal reconstitution but relatively poor perfusion in the left MCA territory. No other intracranial large vessel  occlusion or  hemodynamically significant stenosis. No hemodynamically significant stenosis in the neck.   Cerebral angio   Post IR CT Left M2/MCA posterior division branch occlusion extending into the distal M1. Mechanical thrombectomy performed with direct contact aspiration. M2 recanalized after one pass with distal embolus to same territory. 3 additional passes performed with complete recanalization (TICI3). Trace SAH vs contrast extravasation in the left Sylvian fissure on post procedural flat panel CT. The patient tolerated the procedure well without incident or complication and is in stable condition.  MRI patchy left hemispheric infarcts involving frontal lobes ACA/MCA watershed      Carotid Ultrasound : bilateral minimal plaque/intimal thcikening 2D Echo   20 to 25% EF TEE no clot or PFO. EF 20% LDL 123 HgbA1c 5.3 VTE prophylaxis -  scd    Diet   Diet Carb Modified Fluid consistency: Thin; Room service appropriate? Yes   No antithrombotic prior to admission, now plan for aspirin and plavix after repeat MRI done today . If stable plan is for Aspirin and plavix x 3 weeks then aspirin alone.  Therapy recommendations:    ST/OT/PT: IPR  Disposition:  IPR Consider 30 day monitor to assess for arrthymia.  Hypertension Home meds:   Ziac Stable Permissive hypertension (OK if < 220/120) but gradually normalize in 5-7 days Long-term BP goal normotensive  Hyperlipidemia Home meds: lipitor '40mg'$  resumed in hospital LDL 123, goal < 70 High intensity statin not indicated   Continue statin at discharge  Other Stroke Risk Factors   Hx brain tumor   Hx stroke/TIA  Congestive heart failure  Other Active Problems Prior brain tumor  Hospital day # 4  This patient was seen and evaluated with Dr. Leonie Man. He directed the plan of care.  Charlene Brooke, NP-C   I have personally obtained history,examined this patient, reviewed notes, independently viewed imaging studies, participated in medical  decision making and plan of care.ROS completed by me personally and pertinent positives fully documented  I have made any additions or clarifications directly to the above note. Agree with note above.  Patient had catheter coronary angio which did not show evidence of significant coronary artery disease.  The etiology for systolic heart failure and cardiomyopathy remains unclear but appreciate cardiology consult and agree adding goal-directed medical therapy including diuretics and beta-blockers.  Await advanced heart failure team consultation today.  Continue mobilization out of bed and ongoing therapies and transfer to inpatient rehab when bed available.  Greater than 50% time during this 35-minute visit was spent on counseling and coordination of care and discussion with patient and care team and answering questions  Antony Contras, MD Medical Director [facility] Pager: [phone] 09/03/2021 1:26 PM  To contact Stroke Continuity provider, please refer to [email or web address] After hours, contact General Neurology

## 2021-09-03 NOTE — Progress Notes (Signed)
STROKE TEAM PROGRESS NOTE   INTERVAL HISTORY No family is at the bedside.     Gina Nichols aphasia continues to improve and she is almost fluent with Gina Nichols speech.   .  Blood pressure adequately controlled.  Vital signs stable.  TEE shows no clot or PFO. EF 20%  Carotid Ultrasound : bilateral minimal plaque/intimal thcikening Vitals:   09/02/21 1934 09/02/21 2044 09/02/21 2338 09/03/21 0354  BP: 115/77  108/89 104/77  Pulse: (!) 110 (!) 106 (!) 105 (!) 107  Resp: '18 17 14 18  '$ Temp: 98.6 F (37 C)  98.6 F (37 C) 98.6 F (37 C)  TempSrc: Oral  Axillary Axillary  SpO2:  98% 100%   Weight:      Height:       CBC:  Recent Labs  Lab 08/30/21 0045 08/30/21 0050 09/02/21 1432  WBC 5.3  --   --   NEUTROABS 3.2  --   --   HGB 12.7 12.9 11.9*  HCT 37.6 38.0 35.0*  MCV 98.4  --   --   PLT 150  --   --    Basic Metabolic Panel:  Recent Labs  Lab 08/30/21 0443 09/01/21 0141 09/02/21 0139 09/02/21 1432  NA  --  141 141 142  K  --  3.6 3.8 3.5  CL  --  111 110  --   CO2  --  26 24  --   GLUCOSE  --  112* 110*  --   BUN  --  13 11  --   CREATININE  --  1.04* 1.00  --   CALCIUM  --  8.4* 8.5*  --   MG 2.0  --   --   --     Lipid Panel:  Recent Labs  Lab 08/30/21 0443  CHOL 190  TRIG 41  HDL 59  CHOLHDL 3.2  VLDL 8  LDLCALC 123*    HgbA1c:  Recent Labs  Lab 08/30/21 0443  HGBA1C 5.3   Urine Drug Screen: No results for input(s): "LABOPIA", "COCAINSCRNUR", "LABBENZ", "AMPHETMU", "THCU", "LABBARB" in the last 168 hours.  Alcohol Level  Recent Labs  Lab 08/30/21 0045  ETH <10    IMAGING past 24 hours CARDIAC CATHETERIZATION  Result Date: 09/02/2021   There is severe left ventricular systolic dysfunction.   LV end diastolic pressure is moderately elevated.  LVEDP 33 mmHg.   The left ventricular ejection fraction is less than 25% by visual estimate.   Hemodynamic findings consistent with moderate pulmonary hypertension.   There is no aortic valve stenosis.   Aortic  saturation 94%, PA saturation 62%, PA pressure 53/32, mean PA pressure 41 mmHg, mean pulmonary capillary wedge pressure 33 mmHg, cardiac output 4.61 L/min, cardiac index 2.41. No angiographically apparent coronary artery disease.  Elevated right heart pressures.  She is volume overloaded.  Continue with plans for diuresis.   ECHO TEE  Result Date: 09/02/2021    TRANSESOPHOGEAL ECHO REPORT   Patient Name:   Gina Nichols Date of Exam: 09/02/2021 Medical Rec #:  [id number]   Height:       65.0 in Accession #:    [id number]  Weight:       185.2 lb Date of Birth:  Jun 24, 1970   BSA:          1.914 m Patient Age:    51 years    BP:           129/85 mmHg Patient Gender: F  HR:           109 bpm. Exam Location:  Inpatient Procedure: Transesophageal Echo, Cardiac Doppler and Color Doppler Indications:     Stroke  History:         Patient has prior history of Echocardiogram examinations, most                  recent 08/30/2021. Abnormal ECG; Stroke.  Sonographer:     Roseanna Rainbow RDCS Referring Phys:  Jennette Diagnosing Phys: Lyman Bishop MD PROCEDURE: After discussion of the risks and benefits of a TEE, an informed consent was obtained from the patient. The transesophogeal probe was passed without difficulty through the esophogus of the patient. Imaged were obtained with the patient in a supine position. Sedation performed by different physician. The patient was monitored while under deep sedation. Anesthestetic sedation was provided intravenously by Anesthesiology: '20mg'$  of Propofol. The patient's vital signs; including heart rate, blood  pressure, and oxygen saturation; remained stable throughout the procedure. The patient developed no complications during the procedure. IMPRESSIONS  1. Left ventricular ejection fraction, by estimation, is <20%. The left ventricle has severely decreased function.  2. Right ventricular systolic function is normal. The right ventricular size is normal.  3. Left  atrial size was moderately dilated. No left atrial/left atrial appendage thrombus was detected.  4. Right atrial size was mildly dilated.  5. The mitral valve is abnormal. Mild to moderate mitral valve regurgitation.  6. The aortic valve is tricuspid. Aortic valve regurgitation is not visualized.  7. Agitated saline contrast bubble study was negative, with no evidence of any interatrial shunt. Conclusion(s)/Recommendation(s): No LA/LAA thrombus identified. Negative bubble study for interatrial shunt. No intracardiac source of embolism detected on this on this transesophageal echocardiogram. FINDINGS  Left Ventricle: Left ventricular ejection fraction, by estimation, is <20%. The left ventricle has severely decreased function. The left ventricular internal cavity size was normal in size. Right Ventricle: The right ventricular size is normal. No increase in right ventricular wall thickness. Right ventricular systolic function is normal. Left Atrium: Left atrial size was moderately dilated. No left atrial/left atrial appendage thrombus was detected. Right Atrium: Right atrial size was mildly dilated. Pericardium: There is no evidence of pericardial effusion. Mitral Valve: The mitral valve is abnormal. Mild to moderate mitral valve regurgitation, with centrally-directed jet. Tricuspid Valve: The tricuspid valve is grossly normal. Tricuspid valve regurgitation is mild. Aortic Valve: The aortic valve is tricuspid. Aortic valve regurgitation is not visualized. Pulmonic Valve: The pulmonic valve was grossly normal. Pulmonic valve regurgitation is trivial. Aorta: The aortic root and ascending aorta are structurally normal, with no evidence of dilitation. IAS/Shunts: No atrial level shunt detected by color flow Doppler. Agitated saline contrast was given intravenously to evaluate for intracardiac shunting. Agitated saline contrast bubble study was negative, with no evidence of any interatrial shunt. Lyman Bishop MD  Electronically signed by Lyman Bishop MD Signature Date/Time: 09/02/2021/2:23:24 PM    Final     PHYSICAL EXAM  Physical Exam  HEENT-  Normocephalic with evidence for prior left craniotomy  Lungs- Respirations unlabored Extremities- No edema   Neurological Examination Mental Status: Awake and alert.  Mild expressive aphasia.  Slight hesitancy in Gina Nichols speech.  Able to follow most verbal commands, most commands no dysarthria Cranial Nerves: II: PERRL at 2 mm. Blinks to threat on the left but not on the right.   III,IV, VI: Right palpebral fissure is wider than on the left. Eyes are conjugate  with leftward gaze; able to track about 5 mm past the midline to the right with significant hesitation. No nystagmus.  V: Brisk response to eyelash stimulation on the left. Weak response to eyelash stimulation on the right.  VII: Right facial droop VIII: Hearing intact to voice IX,X: Unable to assess. Gag deferred due to aspiration risk.  XI: Head preferentially rotated to the left  XII: Midline tongue extension Motor: LUE and LLE 5/5 RUE initially 3/5 with right grip weakness and diminished fine motor skills. RLE initially with 3/5 strength normal strength on the left Sensory: Reacts less briskly to pinch and plantar stimulation on the right.  Deep Tendon Reflexes: 2+ and symmetric throughout Plantars: Right: Mute             Left: downgoing Cerebellar: No ataxia with FNF on the left. Unable to perform on the right.  Gait: Unable to assess NIHSS 3 Lab Results:  ASSESSMENT/PLAN Ms. Weslyn Holsonback is a 51 y.o. 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  1. Initial NIHSS 20, now NIHSS 3 2. CT head:  Postsurgical changes in the left frontal lobe with encephalomalacia and a small somewhat hyperdense mass along the lateral aspect of the left ventricle/resection cavity, which is indeterminate but could represent residual or recurrent tumor, if the postoperative findings are related to tumor resection. No evidence of acute infarct or hemorrhage. ASPECTS is 10 3. Hypokalemic with potassium of 2.8.  4. CTA of head and neck: Occlusion of the proximal left M2, with possible distal reconstitution but relatively poor perfusion in the left MCA territory. No other intracranial large vessel occlusion or hemodynamically significant stenosis. No hemodynamically significant stenosis in the neck.  5. Stroke risk factors: Cancer history.    7. Patient is now s/p VIR  and is more alert with more fluent speech than initial presentation.    Stroke :    left MCA territory  infarct   likely secondary due to occlusion or stenosis source: left M2 occlusion likely cryptogenic source versus underlying new diagnosis of cardiomyopathy Code Stroke     CT head: Postsurgical changes in the left frontal lobe with encephalomalacia and a small somewhat hyperdense mass along the lateral aspect of the left ventricle/resection cavity, which is indeterminate but could represent residual or recurrent tumor, if the postoperative findings are related to tumor resection. No evidence of acute infarct or hemorrhage. ASPECTS is 10  CTA of head and neck: Occlusion of the proximal left M2, with possible distal reconstitution but relatively poor perfusion in the left MCA territory. No other intracranial large vessel occlusion or hemodynamically significant stenosis. No hemodynamically significant stenosis in the neck.   Cerebral angio   Post IR CT Left M2/MCA posterior division branch occlusion extending into the distal M1. Mechanical thrombectomy performed with direct contact aspiration. M2 recanalized after one pass with distal embolus to same  territory. 3 additional passes performed with complete recanalization (TICI3). Trace SAH vs contrast extravasation in the left Sylvian fissure on post procedural flat panel CT. The patient tolerated the procedure well without incident or complication and is in stable condition.  MRI patchy left hemispheric infarcts involving frontal lobes ACA/MCA watershed      Carotid Ultrasound : bilateral minimal plaque/intimal  thcikening 2D Echo   20 to 25%.  TEE no clot or PFO. EF 20% LDL 123 HgbA1c 5.3 VTE prophylaxis -  scd    Diet   Diet Carb Modified Fluid consistency: Thin; Room service appropriate? Yes   No antithrombotic prior to admission, now plan for aspirin and plavix after repeat MRI done today . If stable plan is for Aspirin and plavix x 3 weeks then aspirin alone.  Therapy recommendations:    SLP: continued services OT/PT pending   Disposition:  pending  Consider 30 day monitor to assess for arrthymia.   Hypertension Home meds:    Stable Permissive hypertension (OK if < 220/120) but gradually normalize in 5-7 days Long-term BP goal normotensive  Hyperlipidemia Home meds:   , lipitor '40mg'$  resumed in hospital LDL 123, goal < 70 High intensity statin not indicated   Continue statin at discharge    HgbA1c 5.3, goal < 7.0 CBGs Recent Labs    09/02/21 1945 09/02/21 2109 09/03/21 0632  GLUCAP 120* 93 112*    SSI  Other Stroke Risk Factors   Hx brain tumor   Hx stroke/TIA  Congestive heart failure  Other Active Problems Prior brain tumor  Hospital day # 4 Patient is doing well.  Continue mobilization out of bed.   Continue ongoing therapies.  Appreciate cardiology consult.  Plan for cardiac CTA  tomorrow .    Patient is participating in the [facility] stroke prevention study (standard antiplatelet therapy plus Asundexian factor XI inhibitor versus standard antiplatelet therapy plus placebo ). I long discussion with patient' at the bedside and answered questions.     Greater than 50% time during this 35-minute visit was spent on counseling and coordination of care and discussion with patient and Gina Nichols sister and care team and answering questions.     Antony Contras, MD Medical Director [place] Pager: [phone] 09/03/2021 6:47 AM     To contact Stroke Continuity provider, please refer to [email or web address] After hours, contact General Neurology

## 2021-09-04 DIAGNOSIS — Z87898 Personal history of other specified conditions: Secondary | ICD-10-CM

## 2021-09-04 DIAGNOSIS — I429 Cardiomyopathy, unspecified: Secondary | ICD-10-CM

## 2021-09-04 LAB — CBC
HCT: 35 % — ABNORMAL LOW (ref 36.0–46.0)
Hemoglobin: 11.6 g/dL — ABNORMAL LOW (ref 12.0–15.0)
MCH: 32.8 pg (ref 26.0–34.0)
MCHC: 33.1 g/dL (ref 30.0–36.0)
MCV: 98.9 fL (ref 80.0–100.0)
Platelets: 179 10*3/uL (ref 150–400)
RBC: 3.54 MIL/uL — ABNORMAL LOW (ref 3.87–5.11)
RDW: 15.6 % — ABNORMAL HIGH (ref 11.5–15.5)
WBC: 5.6 10*3/uL (ref 4.0–10.5)
nRBC: 0 % (ref 0.0–0.2)

## 2021-09-04 LAB — BASIC METABOLIC PANEL
Anion gap: 9 (ref 5–15)
BUN: 13 mg/dL (ref 6–20)
CO2: 23 mmol/L (ref 22–32)
Calcium: 8.6 mg/dL — ABNORMAL LOW (ref 8.9–10.3)
Chloride: 110 mmol/L (ref 98–111)
Creatinine, Ser: 0.99 mg/dL (ref 0.44–1.00)
GFR, Estimated: >60 mL/min (ref >60)
Glucose, Bld: 84 mg/dL (ref 70–99)
Potassium: 3.7 mmol/L (ref 3.5–5.1)
Sodium: 142 mmol/L (ref 135–145)

## 2021-09-04 LAB — RAPID URINE DRUG SCREEN, HOSP PERFORMED
Amphetamines: NOT DETECTED
Barbiturates: NOT DETECTED
Benzodiazepines: NOT DETECTED
Cocaine: NOT DETECTED
Opiates: NOT DETECTED
Tetrahydrocannabinol: NOT DETECTED

## 2021-09-04 LAB — GLUCOSE, CAPILLARY
Glucose-Capillary: 104 mg/dL — ABNORMAL HIGH (ref 70–99)
Glucose-Capillary: 89 mg/dL (ref 70–99)
Glucose-Capillary: 154 mg/dL — ABNORMAL HIGH (ref 70–99)
Glucose-Capillary: 103 mg/dL — ABNORMAL HIGH (ref 70–99)

## 2021-09-04 LAB — LIPOPROTEIN A (LPA): Lipoprotein (a): 153.7 nmol/L — ABNORMAL HIGH (ref <75.0)

## 2021-09-04 LAB — TROPONIN I (HIGH SENSITIVITY)
Troponin I (High Sensitivity): 18 ng/L — ABNORMAL HIGH (ref <18)
Troponin I (High Sensitivity): 18 ng/L — ABNORMAL HIGH (ref <18)

## 2021-09-04 MED ORDER — FUROSEMIDE 40 MG PO TABS
40.0000 mg | ORAL_TABLET | Freq: Every day | ORAL | Status: DC
  Administered 2021-09-05: 40 mg via ORAL
  Filled 2021-09-04: qty 1

## 2021-09-04 MED ORDER — LOSARTAN POTASSIUM 25 MG PO TABS
12.5000 mg | ORAL_TABLET | Freq: Every day | ORAL | Status: DC
Start: 2021-09-04 — End: 2021-09-06
  Administered 2021-09-04 – 2021-09-06 (×3): 12.5 mg via ORAL
  Filled 2021-09-04 (×3): qty 1

## 2021-09-04 MED ORDER — POTASSIUM CHLORIDE CRYS ER 20 MEQ PO TBCR
40.0000 meq | EXTENDED_RELEASE_TABLET | Freq: Once | ORAL | Status: AC
  Administered 2021-09-04: 40 meq via ORAL
  Filled 2021-09-04: qty 2

## 2021-09-04 MED ORDER — NITROGLYCERIN 0.4 MG SL SUBL
0.4000 mg | SUBLINGUAL_TABLET | SUBLINGUAL | Status: DC | PRN
  Administered 2021-09-04: 0.4 mg via SUBLINGUAL
  Filled 2021-09-04: qty 1

## 2021-09-04 MED ORDER — MORPHINE SULFATE (PF) 2 MG/ML IV SOLN
2.0000 mg | Freq: Once | INTRAVENOUS | Status: AC
  Administered 2021-09-04: 2 mg via INTRAVENOUS
  Filled 2021-09-04: qty 1

## 2021-09-04 MED ORDER — PANTOPRAZOLE SODIUM 40 MG PO TBEC
40.0000 mg | DELAYED_RELEASE_TABLET | Freq: Every day | ORAL | Status: DC
  Administered 2021-09-04 – 2021-09-06 (×3): 40 mg via ORAL
  Filled 2021-09-04 (×3): qty 1

## 2021-09-04 MED ORDER — APIXABAN 5 MG PO TABS
5.0000 mg | ORAL_TABLET | Freq: Two times a day (BID) | ORAL | Status: DC
  Administered 2021-09-05 – 2021-09-06 (×3): 5 mg via ORAL
  Filled 2021-09-04 (×3): qty 1

## 2021-09-04 NOTE — Plan of Care (Signed)
Called by RN to bedside for patient report of chest pain.   Admitted with stroke, now 4-5 days s/p thrombectomy. Cardiology had been consulted for evaluation of new HFrEF of unclear etiology. LHC yesterday reviewed and showed completely normal coronaries, free of disease. RHC with severe post-capillary PH and borderline cardiac ouput. She is being diuresed.   Tonight was reporting 10/10 chest pain with radiation to neck and left shoulder. Normotensive and on review of tele there has been no increase in heart rate corresponding to her pain and no rhythm change. ECG performed only significant for minor nonspecific T-wave abnormality. Patient appears drowsy and comfortable, answers all questions appropriately. No change in neuro exam. No friction rub or murmurs.   She had no response to nitro. Neurology had ordered morphine and helped temporarily but she again reported chest pain and had the best response with ginger ale. Troponin sent but does not appear to be cardiac etiology. Would consider at least some GI component, though I will also note that she also had no objective signs of significant pain.

## 2021-09-04 NOTE — Progress Notes (Signed)
Morphine administered, pt denies any changes with chest pain.  She complains of 10/10, with discomfort in her LUE radiating to her neck.   On assessment, there is no diaphoresis, BP are stable, she was started on [facility] to help her work of breathing.   Will continue to monitor.

## 2021-09-04 NOTE — Progress Notes (Signed)
ANTICOAGULATION CONSULT NOTE - Initial Consult  Pharmacy Consult for Eliquis Indication: stroke  No Known Allergies  Patient Measurements: Height: '5\' 5"'$  (165.1 cm) Weight: 81.2 kg (179 lb 0.2 oz) IBW/kg (Calculated) : 57  Vital Signs: Temp: 98.4 F (36.9 C) (06/24 1121) Temp Source: Oral (06/24 1121) BP: 105/81 (06/24 1121) Pulse Rate: 97 (06/24 1121)  Labs: Recent Labs    09/02/21 0139 09/02/21 1426 09/02/21 1433 09/03/21 0023 09/04/21 0155 09/04/21 0451  HGB  --    < > 11.6* 11.6* 11.6*  --   HCT  --    < > 34.0* 35.4* 35.0*  --   PLT  --   --   --  183 179  --   CREATININE 1.00  --   --  0.91 0.99  --   TROPONINIHS  --   --   --   --  18* 18*   < > = values in this interval not displayed.    Estimated Creatinine Clearance: 70 mL/min (by C-G formula based on SCr of 0.99 mg/dL).   Medical History: History reviewed. No pertinent past medical history.  Medications:  Scheduled:   atorvastatin  40 mg Oral Daily   digoxin  0.125 mg Oral Daily   [START ON 09/05/2021] furosemide  40 mg Oral Daily   insulin aspart  0-15 Units Subcutaneous TID WC   losartan  12.5 mg Oral Daily   pantoprazole  40 mg Oral Q1200   potassium chloride  40 mEq Oral Daily   sodium chloride flush  3 mL Intravenous Q12H   spironolactone  12.5 mg Oral Daily   Infusions:   sodium chloride     lactated ringers      Assessment: 72 YOF was in the [facility] research trial, however, the treatment team was concerned given her stroke would prefer switching to eliquis. Spoke with Rosalin Hawking about switching therapies from the asundexian 50 mg daily to apixaban 5 mg BID starting tomorrow when the next dose of asundexian would be due (last dose 6/24 0930). CBC is stable and no current s/sx of bleeding.  Goal of Therapy:  Monitor platelets by anticoagulation protocol: Yes   Plan:  Stop OCEANIC study drug Start Eliquis 5 mg BID starting tomorrow morning Continue to monitor CBC, s/sx of bleeding or  clotting, and renal function.  Varney Daily, PharmD PGY1 Pharmacy Resident  Please check AMION for all [facility] pharmacy phone numbers After 10:00 PM call main pharmacy [phone]

## 2021-09-04 NOTE — Progress Notes (Addendum)
Patient calling out with CP 5/10, appears to be SOB.   She was coached on pursed lip breathing, given a ginger ale with some improvement to her symptoms.   Cardiology physician at bedside.   Repeat EKG done at bedside no changes to her rhythm.

## 2021-09-04 NOTE — Progress Notes (Signed)
STROKE TEAM PROGRESS NOTE   INTERVAL HISTORY No family at the bedside.  Patient doing well, walking in the room, no complaints.  Of note, overnight patient had event of chest pain, reporting 10/10, radiating to neck and left shoulder.  Cardiology on board, had EKG, troponin level, and detailed exam, determined that likely not cardiogenic instead of GI component.  No further treatment needed at this time.  Cardiology Dr. Haroldine Laws on board, recommend anticoagulation in the setting of low EF.  Vitals:   09/04/21 0500 09/04/21 0559 09/04/21 0719 09/04/21 1121  BP:   (!) 119/92 105/81  Pulse:   91 97  Resp:   16 16  Temp:   98.3 F (36.8 C) 98.4 F (36.9 C)  TempSrc:   Oral Oral  SpO2:   100% 100%  Weight: 81.1 kg 81.2 kg    Height:       CBC:  Recent Labs  Lab 08/30/21 0045 08/30/21 0050 09/03/21 0023 09/04/21 0155  WBC 5.3  --  5.6 5.6  NEUTROABS 3.2  --   --   --   HGB 12.7   < > 11.6* 11.6*  HCT 37.6   < > 35.4* 35.0*  MCV 98.4  --  98.6 98.9  PLT 150  --  183 179   < > = values in this interval not displayed.   Basic Metabolic Panel:  Recent Labs  Lab 08/30/21 0443 09/01/21 0141 09/03/21 0023 09/04/21 0155  NA  --    < > 141 142  K  --    < > 3.9 3.7  CL  --    < > 109 110  CO2  --    < > 24 23  GLUCOSE  --    < > 95 84  BUN  --    < > 11 13  CREATININE  --    < > 0.91 0.99  CALCIUM  --    < > 8.5* 8.6*  MG 2.0  --   --   --    < > = values in this interval not displayed.    Lipid Panel:  Recent Labs  Lab 08/30/21 0443  CHOL 190  TRIG 41  HDL 59  CHOLHDL 3.2  VLDL 8  LDLCALC 123*    HgbA1c:  Recent Labs  Lab 08/30/21 0443  HGBA1C 5.3   Urine Drug Screen:  Recent Labs  Lab 09/04/21 0021  LABOPIA NONE DETECTED  COCAINSCRNUR NONE DETECTED  LABBENZ NONE DETECTED  AMPHETMU NONE DETECTED  THCU NONE DETECTED  LABBARB NONE DETECTED    Alcohol Level  Recent Labs  Lab 08/30/21 0045  ETH <10    IMAGING past 24 hours No results  found.  PHYSICAL EXAM  Physical Exam  HEENT-  Normocephalic with evidence for prior left craniotomy  Lungs- Respirations unlabored Extremities- No edema   Neurological Examination Mental Status: Awake and alert.  Mild expressive aphasia.  Slight hesitancy in her speech.  Able to follow all verbal commands, no dysarthria Cranial Nerves: II: Visual fields full III,IV, VI: No disconjugate eyes, no nystagmus.  V: Brisk response to eyelash stimulation on the left. Weak response to eyelash stimulation on the right.  VII: Facial symmetrical VIII: Hearing intact to voice IX,X: Unremarkable XI: Shoulder shrug symmetrical XII: Midline tongue extension Motor: LUE and LLE 5/5 RUE and RLE 5/5 Sensory: Symmetrical Deep Tendon Reflexes: 2+ and symmetric throughout Plantars: Right: Downgoing  left: downgoing Cerebellar: No ataxia with FNF bilaterally Gait: No hemiparetic gait   ASSESSMENT/PLAN Ms. Gloriann Riede is a 51 y.o. 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    Stroke :  left MCA multifocal small/punctate infarcts due to left M1/M2 occlusion s/p IR with TICI3 and trace sylvian fissure SAH, likely from new diagnosis of cardiomyopathy with low EF CT head: Postsurgical changes in the left frontal lobe with encephalomalacia and a small somewhat hyperdense mass along the lateral aspect of the left ventricle/resection cavity. ASPECTS 10 CTA of head and neck: Occlusion of the proximal left M2, with possible distal reconstitution but relatively poor perfusion in the left MCA territory.  IR left M1/M2 occlusion with distal embolus, achieving TICI3  Post IR CT Trace SAH vs contrast extravasation  in the left Sylvian fissure  MRI patchy left hemispheric infarcts involving frontal lobes ACA/MCA watershed Carotid Ultrasound unremarkable 2D Echo EF 20 to 25%  TEE no clot or PFO. EF 20% Consider 30 day monitor to assess arrhythmia on discharge. LDL 123 HgbA1c 5.3 VTE prophylaxis - Eliquis No antithrombotic prior to admission, now on eliquis given low EF in the setting of embolic stroke.  Therapy recommendations - outpatient OT Disposition - pending  Cardiomyopathy New diagnosis Cardiac cath negative for CAD 2D Echo EF 20 to 25%  TEE no clot or PFO. EF 20% Cardiology Dr. Haroldine Laws on board Recommend [facility], currently on Eliquis 5 mg twice daily On digoxin, Lasix and spironolactone Pending cardiac MRI on Monday  Hypertension Home meds:   Ziac Stable now On lasix and spironolactone Long-term BP goal normotensive  Hyperlipidemia Home meds: lipitor '40mg'$  resumed in hospital LDL 123, goal < 70 Continue statin at discharge  Other Stroke Risk Factors   Other Active Problems Prior left frontal brain tumor status postresection 20 years ago Stable lesion along left frontal horn since 2012 Initially enrolled to [facility] trial, but study drug discontinued after determined to be need of anticoagulation  Hospital day # 5  Rosalin Hawking, MD PhD Stroke Neurology 09/04/2021 2:24 PM   To contact Stroke Continuity provider, please refer to [email or web address] After hours, contact General Neurology

## 2021-09-04 NOTE — Progress Notes (Signed)
PT C/O 10/10 CP, EKG  NSR, bp STABLE, Cardiology/ Neuro on call physician paged.

## 2021-09-04 NOTE — Progress Notes (Signed)
Advanced Heart Failure Rounding Note   Subjective:    Reported 10/10 CP overnight but work-up unrevealing. (Hstrop 18. Cors normal on cath)  Resting comfortably.    Objective:   Weight Range:  Vital Signs:   Temp:  [97.4 F (36.3 C)-98.7 F (37.1 C)] 98.3 F (36.8 C) (06/24 0719) Pulse Rate:  [54-110] 91 (06/24 0719) Resp:  [14-18] 16 (06/24 0719) BP: (99-120)/(61-92) 119/92 (06/24 0719) SpO2:  [100 %] 100 % (06/24 0719) Weight:  [81.1 kg-81.2 kg] 81.2 kg (06/24 0559) Last BM Date : 09/02/21  Weight change: Filed Weights   09/02/21 1043 09/04/21 0500 09/04/21 0559  Weight: 84 kg 81.1 kg 81.2 kg    Intake/Output:   Intake/Output Summary (Last 24 hours) at 09/04/2021 1029 Last data filed at 09/04/2021 0000 Gross per 24 hour  Intake 3 ml  Output 3 ml  Net 0 ml     Physical Exam: General:  Lying in bed . No resp difficulty HEENT: normal Neck: supple. JVP not elevated . Carotids 2+ bilat; no bruits. No lymphadenopathy or thryomegaly appreciated. Cor: PMI nondisplaced. Regular rate & rhythm. No rubs, gallops or murmurs. Lungs: clear Abdomen: soft, nontender, nondistended. No hepatosplenomegaly. No bruits or masses. Good bowel sounds. Extremities: no cyanosis, clubbing, rash, edema Neuro: alert & orientedx3, cranial nerves grossly intact. moves all 4 extremities w/o difficulty. Affect pleasant  Telemetry: Sinus 90 Personally reviewed   Labs: Basic Metabolic Panel: Recent Labs  Lab 08/30/21 0045 08/30/21 0050 08/30/21 0443 09/01/21 0141 09/02/21 0139 09/02/21 1426 09/02/21 1432 09/02/21 1433 09/03/21 0023 09/04/21 0155  NA 141 143  --  141 141 142 142 142 141 142  K 2.9* 2.8*  --  3.6 3.8 3.4* 3.5 3.4* 3.9 3.7  CL 107 104  --  111 110  --   --   --  109 110  CO2 24  --   --  26 24  --   --   --  24 23  GLUCOSE 131* 131*  --  112* 110*  --   --   --  95 84  BUN 22* 21*  --  13 11  --   --   --  11 13  CREATININE 1.27* 1.30*  --  1.04* 1.00  --   --    --  0.91 0.99  CALCIUM 8.4*  --   --  8.4* 8.5*  --   --   --  8.5* 8.6*  MG  --   --  2.0  --   --   --   --   --   --   --     Liver Function Tests: Recent Labs  Lab 08/30/21 0045  AST 24  ALT 77*  ALKPHOS 74  BILITOT 0.5  PROT 5.3*  ALBUMIN 3.0*   No results for input(s): "LIPASE", "AMYLASE" in the last 168 hours. No results for input(s): "AMMONIA" in the last 168 hours.  CBC: Recent Labs  Lab 08/30/21 0045 08/30/21 0050 09/02/21 1426 09/02/21 1432 09/02/21 1433 09/03/21 0023 09/04/21 0155  WBC 5.3  --   --   --   --  5.6 5.6  NEUTROABS 3.2  --   --   --   --   --   --   HGB 12.7   < > 11.6* 11.9* 11.6* 11.6* 11.6*  HCT 37.6   < > 34.0* 35.0* 34.0* 35.4* 35.0*  MCV 98.4  --   --   --   --  98.6 98.9  PLT 150  --   --   --   --  183 179   < > = values in this interval not displayed.    Cardiac Enzymes: No results for input(s): "CKTOTAL", "CKMB", "CKMBINDEX", "TROPONINI" in the last 168 hours.  BNP: BNP (last 3 results) Recent Labs    08/30/21 0045 09/01/21 0141  BNP 999.9* 1,548.4*    ProBNP (last 3 results) No results for input(s): "PROBNP" in the last 8760 hours.    Other results:  Imaging: CARDIAC CATHETERIZATION  Result Date: 09/02/2021   There is severe left ventricular systolic dysfunction.   LV end diastolic pressure is moderately elevated.  LVEDP 33 mmHg.   The left ventricular ejection fraction is less than 25% by visual estimate.   Hemodynamic findings consistent with moderate pulmonary hypertension.   There is no aortic valve stenosis.   Aortic saturation 94%, PA saturation 62%, PA pressure 53/32, mean PA pressure 41 mmHg, mean pulmonary capillary wedge pressure 33 mmHg, cardiac output 4.61 L/min, cardiac index 2.41. No angiographically apparent coronary artery disease.  Elevated right heart pressures.  She is volume overloaded.  Continue with plans for diuresis.   ECHO TEE  Result Date: 09/02/2021    TRANSESOPHOGEAL ECHO REPORT   Patient  Name:   Gina Nichols Date of Exam: 09/02/2021 Medical Rec #:  [id number]   Height:       65.0 in Accession #:    [id number]  Weight:       185.2 lb Date of Birth:  [birth year]   BSA:          1.914 m Patient Age:    51 years    BP:           129/85 mmHg Patient Gender: F           HR:           109 bpm. Exam Location:  Inpatient Procedure: Transesophageal Echo, Cardiac Doppler and Color Doppler Indications:     Stroke  History:         Patient has prior history of Echocardiogram examinations, most                  recent 08/30/2021. Abnormal ECG; Stroke.  Sonographer:     Roseanna Rainbow RDCS Referring Phys:  [place] Diagnosing Phys: Lyman Bishop MD PROCEDURE: After discussion of the risks and benefits of a TEE, an informed consent was obtained from the patient. The transesophogeal probe was passed without difficulty through the esophogus of the patient. Imaged were obtained with the patient in a supine position. Sedation performed by different physician. The patient was monitored while under deep sedation. Anesthestetic sedation was provided intravenously by Anesthesiology: '20mg'$  of Propofol. The patient's vital signs; including heart rate, blood  pressure, and oxygen saturation; remained stable throughout the procedure. The patient developed no complications during the procedure. IMPRESSIONS  1. Left ventricular ejection fraction, by estimation, is <20%. The left ventricle has severely decreased function.  2. Right ventricular systolic function is normal. The right ventricular size is normal.  3. Left atrial size was moderately dilated. No left atrial/left atrial appendage thrombus was detected.  4. Right atrial size was mildly dilated.  5. The mitral valve is abnormal. Mild to moderate mitral valve regurgitation.  6. The aortic valve is tricuspid. Aortic valve regurgitation is not visualized.  7. Agitated saline contrast bubble study was negative, with no evidence of any interatrial shunt.  Conclusion(s)/Recommendation(s): No  LA/LAA thrombus identified. Negative bubble study for interatrial shunt. No intracardiac source of embolism detected on this on this transesophageal echocardiogram. FINDINGS  Left Ventricle: Left ventricular ejection fraction, by estimation, is <20%. The left ventricle has severely decreased function. The left ventricular internal cavity size was normal in size. Right Ventricle: The right ventricular size is normal. No increase in right ventricular wall thickness. Right ventricular systolic function is normal. Left Atrium: Left atrial size was moderately dilated. No left atrial/left atrial appendage thrombus was detected. Right Atrium: Right atrial size was mildly dilated. Pericardium: There is no evidence of pericardial effusion. Mitral Valve: The mitral valve is abnormal. Mild to moderate mitral valve regurgitation, with centrally-directed jet. Tricuspid Valve: The tricuspid valve is grossly normal. Tricuspid valve regurgitation is mild. Aortic Valve: The aortic valve is tricuspid. Aortic valve regurgitation is not visualized. Pulmonic Valve: The pulmonic valve was grossly normal. Pulmonic valve regurgitation is trivial. Aorta: The aortic root and ascending aorta are structurally normal, with no evidence of dilitation. IAS/Shunts: No atrial level shunt detected by color flow Doppler. Agitated saline contrast was given intravenously to evaluate for intracardiac shunting. Agitated saline contrast bubble study was negative, with no evidence of any interatrial shunt. Lyman Bishop MD Electronically signed by Lyman Bishop MD Signature Date/Time: 09/02/2021/2:23:24 PM    Final      Medications:     Scheduled Medications:  atorvastatin  40 mg Oral Daily   digoxin  0.125 mg Oral Daily   furosemide  40 mg Intravenous Q12H   insulin aspart  0-15 Units Subcutaneous TID WC   potassium chloride  40 mEq Oral Daily   sodium chloride flush  3 mL Intravenous Q12H   spironolactone   12.5 mg Oral Daily   OCEANIC-STROKE asundexian or placebo  1 tablet Oral Daily    Infusions:  sodium chloride     lactated ringers      PRN Medications: sodium chloride, acetaminophen **OR** acetaminophen (TYLENOL) oral liquid 160 mg/5 mL **OR** acetaminophen, acetaminophen, albuterol, nitroGLYCERIN, ondansetron (ZOFRAN) IV, phenol, polyvinyl alcohol, senna-docusate, sodium chloride flush   Assessment/Plan :   1. 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 Concerning for thrombus with reduced EF ~20%  - suspect cardio-embolic -> would suggest AC when ok from neuro standpoint   2. Acute HFrEF, NICM  -Echo EF 20-25% RV stable.  -Cath with normal , elevated filling pressures, and preserved cardiac output.  - TEE - No PFO/thrombus but with smoke in LV. Concerning for thrombus with reduced EF ~20%  - Will obtain CMRI Monday to further assess.  - HIV NR. Check TSH.  - Doing well. - Volume status improved. Can stop IV lasix - Continue dig and spironolactone 12.5 mg daily  - Start losartan 12.5 - Start lasix 40 mg po daily  - Follow daily BMET  - Daily weight, strict I/Os, and low sodium diet.    3. Sinus Tach - improved   4. H/O SVT with ablation -Had ablation 2015 at [facility]    5. Tobacco Abuse  6. Chest pain - doubt cardiac. Normal cors on cath hstrop 18 - start protonix   Length of Stay: 5   Glori Bickers MD 09/04/2021, 10:29 AM  Advanced Heart Failure Team Pager [phone] (M-F; [place])  Please contact [facility] for night-coverage after hours (4p -7a ) and weekends on [email or web address]

## 2021-09-05 LAB — GLUCOSE, CAPILLARY
Glucose-Capillary: 86 mg/dL (ref 70–99)
Glucose-Capillary: 91 mg/dL (ref 70–99)
Glucose-Capillary: 103 mg/dL — ABNORMAL HIGH (ref 70–99)
Glucose-Capillary: 92 mg/dL (ref 70–99)

## 2021-09-05 LAB — CBC
HCT: 35.2 % — ABNORMAL LOW (ref 36.0–46.0)
Hemoglobin: 11.4 g/dL — ABNORMAL LOW (ref 12.0–15.0)
MCH: 32.3 pg (ref 26.0–34.0)
MCHC: 32.4 g/dL (ref 30.0–36.0)
MCV: 99.7 fL (ref 80.0–100.0)
Platelets: 204 10*3/uL (ref 150–400)
RBC: 3.53 MIL/uL — ABNORMAL LOW (ref 3.87–5.11)
RDW: 15.4 % (ref 11.5–15.5)
WBC: 5 10*3/uL (ref 4.0–10.5)
nRBC: 0 % (ref 0.0–0.2)

## 2021-09-05 LAB — BASIC METABOLIC PANEL
Anion gap: 4 — ABNORMAL LOW (ref 5–15)
BUN: 14 mg/dL (ref 6–20)
CO2: 26 mmol/L (ref 22–32)
Calcium: 8.8 mg/dL — ABNORMAL LOW (ref 8.9–10.3)
Chloride: 111 mmol/L (ref 98–111)
Creatinine, Ser: 1.08 mg/dL — ABNORMAL HIGH (ref 0.44–1.00)
GFR, Estimated: >60 mL/min (ref >60)
Glucose, Bld: 86 mg/dL (ref 70–99)
Potassium: 4.5 mmol/L (ref 3.5–5.1)
Sodium: 141 mmol/L (ref 135–145)

## 2021-09-05 NOTE — Progress Notes (Signed)
Advanced Heart Failure Rounding Note   Subjective:    Feels ok. Denies recurrent CP. No SOB.  Now on Eliquis.    Objective:   Weight Range:  Vital Signs:   Temp:  [97.6 F (36.4 C)-98.7 F (37.1 C)] 98.2 F (36.8 C) (06/25 0739) Pulse Rate:  [51-100] 90 (06/25 0739) Resp:  [16-18] 18 (06/25 0739) BP: (99-170)/(70-150) 111/87 (06/25 0739) SpO2:  [100 %] 100 % (06/25 0739) Weight:  [81.2 kg] 81.2 kg (06/25 0500) Last BM Date : 09/02/21  Weight change: Filed Weights   09/04/21 0500 09/04/21 0559 09/05/21 0500  Weight: 81.1 kg 81.2 kg 81.2 kg    Intake/Output:  No intake or output data in the 24 hours ending 09/05/21 1610    Physical Exam: General:  Well appearing. No resp difficulty HEENT: normal Neck: supple. no JVD. Carotids 2+ bilat; no bruits. No lymphadenopathy or thryomegaly appreciated. Cor: PMI nondisplaced. Regular rate & rhythm. No rubs, gallops or murmurs. Lungs: clear Abdomen: soft, nontender, nondistended. No hepatosplenomegaly. No bruits or masses. Good bowel sounds. Extremities: no cyanosis, clubbing, rash, edema Neuro: alert & orientedx3, cranial nerves grossly intact. moves all 4 extremities w/o difficulty. Affect pleasant   Telemetry: Sinus 90s Personally reviewed    Labs: Basic Metabolic Panel: Recent Labs  Lab 08/30/21 0443 09/01/21 0141 09/02/21 0139 09/02/21 1426 09/02/21 1432 09/02/21 1433 09/03/21 0023 09/04/21 0155 09/05/21 0328  NA  --  141 141   < > 142 142 141 142 141  K  --  3.6 3.8   < > 3.5 3.4* 3.9 3.7 4.5  CL  --  111 110  --   --   --  109 110 111  CO2  --  26 24  --   --   --  '24 23 26  '$ GLUCOSE  --  112* 110*  --   --   --  95 84 86  BUN  --  13 11  --   --   --  '11 13 14  '$ CREATININE  --  1.04* 1.00  --   --   --  0.91 0.99 1.08*  CALCIUM  --  8.4* 8.5*  --   --   --  8.5* 8.6* 8.8*  MG 2.0  --   --   --   --   --   --   --   --    < > = values in this interval not displayed.     Liver Function  Tests: Recent Labs  Lab 08/30/21 0045  AST 24  ALT 77*  ALKPHOS 74  BILITOT 0.5  PROT 5.3*  ALBUMIN 3.0*    No results for input(s): "LIPASE", "AMYLASE" in the last 168 hours. No results for input(s): "AMMONIA" in the last 168 hours.  CBC: Recent Labs  Lab 08/30/21 0045 08/30/21 0050 09/02/21 1432 09/02/21 1433 09/03/21 0023 09/04/21 0155 09/05/21 0328  WBC 5.3  --   --   --  5.6 5.6 5.0  NEUTROABS 3.2  --   --   --   --   --   --   HGB 12.7   < > 11.9* 11.6* 11.6* 11.6* 11.4*  HCT 37.6   < > 35.0* 34.0* 35.4* 35.0* 35.2*  MCV 98.4  --   --   --  98.6 98.9 99.7  PLT 150  --   --   --  183 179 204   < > = values in this  interval not displayed.     Cardiac Enzymes: No results for input(s): "CKTOTAL", "CKMB", "CKMBINDEX", "TROPONINI" in the last 168 hours.  BNP: BNP (last 3 results) Recent Labs    08/30/21 0045 09/01/21 0141  BNP 999.9* 1,548.4*     ProBNP (last 3 results) No results for input(s): "PROBNP" in the last 8760 hours.    Other results:  Imaging: No results found.   Medications:     Scheduled Medications:  apixaban  5 mg Oral BID   atorvastatin  40 mg Oral Daily   digoxin  0.125 mg Oral Daily   furosemide  40 mg Oral Daily   insulin aspart  0-15 Units Subcutaneous TID WC   losartan  12.5 mg Oral Daily   pantoprazole  40 mg Oral Q1200   sodium chloride flush  3 mL Intravenous Q12H   spironolactone  12.5 mg Oral Daily    Infusions:  sodium chloride     lactated ringers      PRN Medications: sodium chloride, acetaminophen **OR** acetaminophen (TYLENOL) oral liquid 160 mg/5 mL **OR** acetaminophen, acetaminophen, albuterol, nitroGLYCERIN, ondansetron (ZOFRAN) IV, phenol, polyvinyl alcohol, senna-docusate, sodium chloride flush   Assessment/Plan :   1. 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 Acute HFrEF, NICM  - Echo EF 20-25% RV stable.  - Cath 6/23 with normal cors, elevated filling pressures, and preserved cardiac output.  - TEE - EF 20% No PFO/thrombus but with smoke in LA.  - Will obtain CMRI Monday to further assess.  - Improving - Volume status ok. Stop lasix. Switch to Jardiance - Continue dig 0.1125 - Continue spironolactone 12.5 mg daily  - Continue losartan 12.5   3. Sinus Tach - improved   4. H/O SVT with ablation -Had ablation 2015 at [facility]    5. Tobacco Abuse - aware of need for cessation  6. Chest pain - doubt cardiac. Normal cors on cath hstrop 18 - started protonix   Length of Stay: 6   Glori Bickers MD 09/05/2021, 9:21 AM  Advanced Heart Failure Team Pager [phone] (M-F; [place])  Please contact [facility] for night-coverage after hours (4p -7a ) and weekends on [email or web address]

## 2021-09-05 NOTE — Progress Notes (Signed)
STROKE TEAM PROGRESS NOTE   INTERVAL HISTORY Dr. Haroldine Laws is at the bedside.  Patient still has mild expressive aphasia, repeating words and hesitancy of speech.  Cardiology on board, continued digoxin Lasix and spironolactone, added Cozaar low-dose 12.5 today.  Eliquis started this morning.  Pending cardiac MRI in a.m.  Vitals:   09/05/21 0326 09/05/21 0500 09/05/21 0739 09/05/21 1116  BP: 104/84  111/87 110/74  Pulse: 67  90 90  Resp: '17  18 18  '$ Temp: 97.6 F (36.4 C)  98.2 F (36.8 C) 98.7 F (37.1 C)  TempSrc:   Oral Oral  SpO2: 100%  100% 100%  Weight:  81.2 kg    Height:       CBC:  Recent Labs  Lab 08/30/21 0045 08/30/21 0050 09/04/21 0155 09/05/21 0328  WBC 5.3   < > 5.6 5.0  NEUTROABS 3.2  --   --   --   HGB 12.7   < > 11.6* 11.4*  HCT 37.6   < > 35.0* 35.2*  MCV 98.4   < > 98.9 99.7  PLT 150   < > 179 204   < > = values in this interval not displayed.   Basic Metabolic Panel:  Recent Labs  Lab 08/30/21 0443 09/01/21 0141 09/04/21 0155 09/05/21 0328  NA  --    < > 142 141  K  --    < > 3.7 4.5  CL  --    < > 110 111  CO2  --    < > 23 26  GLUCOSE  --    < > 84 86  BUN  --    < > 13 14  CREATININE  --    < > 0.99 1.08*  CALCIUM  --    < > 8.6* 8.8*  MG 2.0  --   --   --    < > = values in this interval not displayed.    Lipid Panel:  Recent Labs  Lab 08/30/21 0443  CHOL 190  TRIG 41  HDL 59  CHOLHDL 3.2  VLDL 8  LDLCALC 123*    HgbA1c:  Recent Labs  Lab 08/30/21 0443  HGBA1C 5.3   Urine Drug Screen:  Recent Labs  Lab 09/04/21 0021  LABOPIA NONE DETECTED  COCAINSCRNUR NONE DETECTED  LABBENZ NONE DETECTED  AMPHETMU NONE DETECTED  THCU NONE DETECTED  LABBARB NONE DETECTED    Alcohol Level  Recent Labs  Lab 08/30/21 0045  ETH <10    IMAGING past 24 hours No results found.  PHYSICAL EXAM  Physical Exam  HEENT-  Normocephalic with evidence for prior left craniotomy  Lungs- Respirations unlabored Extremities- No edema    Neurological Examination Mental Status: Awake and alert.  Mild expressive aphasia.  Frequent word repeating and slight hesitancy in her speech.  Able to follow all verbal commands, no dysarthria Cranial Nerves: II: Visual fields full III,IV, VI: No disconjugate eyes, no nystagmus.  V: Brisk response to eyelash stimulation on the left. Weak response to eyelash stimulation on the right.  VII: Facial symmetrical VIII: Hearing intact to voice IX,X: Unremarkable XI: Shoulder shrug symmetrical XII: Midline tongue extension Motor: LUE and LLE 5/5 RUE and RLE 5/5 Sensory: Symmetrical Deep Tendon Reflexes: 2+ and symmetric throughout Plantars: Right: Downgoing             left: downgoing Cerebellar: No ataxia with FNF bilaterally Gait: No hemiparetic gait   ASSESSMENT/PLAN Ms. Michayla Mcneil is a 51  y.o. 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    Stroke :  left MCA multifocal small/punctate infarcts due to left M1/M2 occlusion s/p IR with TICI3 and trace sylvian fissure SAH, likely from new diagnosis of cardiomyopathy with low EF CT head: Postsurgical changes in the left frontal lobe with encephalomalacia and a small somewhat hyperdense mass along the lateral aspect of the left ventricle/resection cavity. ASPECTS 10 CTA of head and neck: Occlusion of the proximal left M2, with possible distal reconstitution but relatively poor perfusion in the left MCA territory.  IR left M1/M2 occlusion with distal embolus, achieving TICI3  Post IR CT Trace SAH vs contrast extravasation in the left Sylvian fissure  MRI patchy left hemispheric infarcts involving frontal lobes ACA/MCA watershed Carotid Ultrasound  unremarkable 2D Echo EF 20 to 25%  TEE no clot or PFO. EF 20% Will consider 30 day monitor to assess arrhythmia on discharge. LDL 123 HgbA1c 5.3 VTE prophylaxis - Eliquis No antithrombotic prior to admission, now on eliquis given low EF in the setting of embolic stroke.  Therapy recommendations - outpatient OT Disposition - pending  Cardiomyopathy New diagnosis Cardiac cath negative for CAD 2D Echo EF 20 to 25%  TEE no clot or PFO. EF 20% Cardiology Dr. 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 After hours, contact General Neurology

## 2021-09-06 ENCOUNTER — Other Ambulatory Visit (HOSPITAL_COMMUNITY): Payer: Self-pay

## 2021-09-06 ENCOUNTER — Telehealth (HOSPITAL_COMMUNITY): Payer: Self-pay | Admitting: Pharmacy Technician

## 2021-09-06 ENCOUNTER — Inpatient Hospital Stay (HOSPITAL_COMMUNITY): Payer: Medicare HMO

## 2021-09-06 DIAGNOSIS — I6389 Other cerebral infarction: Secondary | ICD-10-CM

## 2021-09-06 DIAGNOSIS — I255 Ischemic cardiomyopathy: Secondary | ICD-10-CM

## 2021-09-06 LAB — CBC
HCT: 33.5 % — ABNORMAL LOW (ref 36.0–46.0)
Hemoglobin: 10.9 g/dL — ABNORMAL LOW (ref 12.0–15.0)
MCH: 32.9 pg (ref 26.0–34.0)
MCHC: 32.5 g/dL (ref 30.0–36.0)
MCV: 101.2 fL — ABNORMAL HIGH (ref 80.0–100.0)
Platelets: 187 10*3/uL (ref 150–400)
RBC: 3.31 MIL/uL — ABNORMAL LOW (ref 3.87–5.11)
RDW: 15.1 % (ref 11.5–15.5)
WBC: 5.2 10*3/uL (ref 4.0–10.5)
nRBC: 0 % (ref 0.0–0.2)

## 2021-09-06 LAB — GLUCOSE, CAPILLARY
Glucose-Capillary: 89 mg/dL (ref 70–99)
Glucose-Capillary: 100 mg/dL — ABNORMAL HIGH (ref 70–99)

## 2021-09-06 LAB — BASIC METABOLIC PANEL
Anion gap: 7 (ref 5–15)
BUN: 13 mg/dL (ref 6–20)
CO2: 22 mmol/L (ref 22–32)
Calcium: 8.1 mg/dL — ABNORMAL LOW (ref 8.9–10.3)
Chloride: 106 mmol/L (ref 98–111)
Creatinine, Ser: 1.07 mg/dL — ABNORMAL HIGH (ref 0.44–1.00)
GFR, Estimated: >60 mL/min (ref >60)
Glucose, Bld: 87 mg/dL (ref 70–99)
Potassium: 3.8 mmol/L (ref 3.5–5.1)
Sodium: 135 mmol/L (ref 135–145)

## 2021-09-06 MED ORDER — EMPAGLIFLOZIN 10 MG PO TABS
10.0000 mg | ORAL_TABLET | Freq: Every day | ORAL | Status: DC
Start: 2021-09-06 — End: 2021-09-06
  Administered 2021-09-06: 10 mg via ORAL
  Filled 2021-09-06: qty 1

## 2021-09-06 MED ORDER — SPIRONOLACTONE 25 MG PO TABS
12.5000 mg | ORAL_TABLET | Freq: Every day | ORAL | 1 refills | Status: DC
Start: 2021-09-07 — End: 2021-10-19
  Filled 2021-09-06: qty 30, 60d supply, fill #0

## 2021-09-06 MED ORDER — APIXABAN 5 MG PO TABS
5.0000 mg | ORAL_TABLET | Freq: Two times a day (BID) | ORAL | 1 refills | Status: AC
  Filled 2021-09-06: qty 60, 30d supply, fill #0

## 2021-09-06 MED ORDER — LOSARTAN POTASSIUM 25 MG PO TABS
12.5000 mg | ORAL_TABLET | Freq: Every day | ORAL | 1 refills | Status: DC
  Filled 2021-09-06: qty 30, 60d supply, fill #0

## 2021-09-06 MED ORDER — DIGOXIN 125 MCG PO TABS
0.1250 mg | ORAL_TABLET | Freq: Every day | ORAL | 1 refills | Status: DC
  Filled 2021-09-06: qty 30, 30d supply, fill #0

## 2021-09-06 MED ORDER — DAPAGLIFLOZIN PROPANEDIOL 10 MG PO TABS
10.0000 mg | ORAL_TABLET | Freq: Every day | ORAL | 1 refills | Status: DC
  Filled 2021-09-06: qty 30, 30d supply, fill #0

## 2021-09-06 MED ORDER — DAPAGLIFLOZIN PROPANEDIOL 10 MG PO TABS: 10.0000 mg | ORAL_TABLET | Freq: Every day | ORAL | Status: DC

## 2021-09-06 MED ORDER — ATORVASTATIN CALCIUM 40 MG PO TABS
40.0000 mg | ORAL_TABLET | Freq: Every day | ORAL | 1 refills | Status: DC
  Filled 2021-09-06: qty 30, 30d supply, fill #0

## 2021-09-06 MED ORDER — NITROGLYCERIN 0.4 MG SL SUBL
0.4000 mg | SUBLINGUAL_TABLET | SUBLINGUAL | 12 refills | Status: AC | PRN
  Filled 2021-09-06: qty 25, 30d supply, fill #0

## 2021-09-06 MED ORDER — GADOBUTROL 1 MMOL/ML IV SOLN
8.0000 mL | Freq: Once | INTRAVENOUS | Status: AC | PRN
  Administered 2021-09-06: 8 mL via INTRAVENOUS

## 2021-09-06 NOTE — Plan of Care (Signed)
  Problem: Education: Goal: Knowledge of disease or condition will improve Outcome: Progressing Goal: Knowledge of secondary prevention will improve (SELECT ALL) Outcome: Progressing Goal: Knowledge of patient specific risk factors will improve (INDIVIDUALIZE FOR PATIENT) Outcome: Progressing Goal: Individualized Educational Video(s) Outcome: Progressing   Problem: Self-Care: Goal: Ability to participate in self-care as condition permits will improve Outcome: Progressing   Problem: Ischemic Stroke/TIA Tissue Perfusion: Goal: Complications of ischemic stroke/TIA will be minimized Outcome: Progressing

## 2021-09-06 NOTE — Discharge Summary (Addendum)
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 s/p IR with TICI3 and trace sylvian fissure SAH, likely from new diagnosis of cardiomyopathy with low EF  Active Problems:   Acute systolic heart failure ([place])   Cardiomyopathy   Hypertension   Hyperlipidemia   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   atorvastatin 40 MG tablet Commonly known as: LIPITOR Take 1 tablet (40 mg total) by mouth daily. Start taking on: September 07, 2021   dapagliflozin propanediol 10 MG Tabs tablet Commonly known as: FARXIGA Take 1 tablet (10 mg total) by mouth daily. Start taking on: September 07, 2021 What changed:  medication strength how much to take   digoxin 0.125 MG tablet Commonly known as: LANOXIN Take 1 tablet (0.125 mg total) by mouth daily. Start taking on: September 07, 2021   EMERGEN-C IMMUNE PO Take 1 packet by mouth daily.   ergocalciferol 1.25 MG (50000 UT) capsule Commonly known as: VITAMIN D2 Take 50,000 Units by mouth every Monday.   levocetirizine 5 MG tablet Commonly known as: XYZAL Take 5 mg by  mouth daily.   losartan 25 MG tablet Commonly known as: COZAAR Take 0.5 tablets (12.5 mg total) by mouth daily. Start taking on: September 07, 2021   nitroGLYCERIN 0.4 MG SL tablet Commonly known as: NITROSTAT Place 1 tablet (0.4 mg total) under the tongue every 5 (five) minutes as needed for chest pain.   pantoprazole 40 MG tablet Commonly known as: PROTONIX Take 40 mg by mouth 2 (two) times daily.   spironolactone 25 MG tablet Commonly known as: ALDACTONE Take 0.5 tablets (12.5 mg total) by mouth daily. Start taking on: September 07, 2021        LABORATORY STUDIES CBC    Component Value Date/Time   WBC 5.2 09/06/2021 0021   RBC 3.31 (L) 09/06/2021 0021   HGB 10.9 (L) 09/06/2021 0021   HCT 33.5 (L) 09/06/2021 0021   PLT 187 09/06/2021 0021   MCV 101.2 (H) 09/06/2021 0021   MCH 32.9 09/06/2021 0021   MCHC 32.5 09/06/2021 0021   RDW 15.1 09/06/2021 0021   LYMPHSABS 1.6 08/30/2021 0045   MONOABS 0.4 08/30/2021 0045   EOSABS 0.1 08/30/2021 0045   BASOSABS 0.0 08/30/2021 0045   CMP    Component Value Date/Time   NA 135 09/06/2021 0021   K 3.8 09/06/2021 0021   CL 106 09/06/2021 0021   CO2 22 09/06/2021 0021   GLUCOSE 87 09/06/2021 0021   BUN 13 09/06/2021 0021  CREATININE 1.07 (H) 09/06/2021 0021   CALCIUM 8.1 (L) 09/06/2021 0021   PROT 5.3 (L) 08/30/2021 0045   ALBUMIN 3.0 (L) 08/30/2021 0045   AST 24 08/30/2021 0045   ALT 77 (H) 08/30/2021 0045   ALKPHOS 74 08/30/2021 0045   BILITOT 0.5 08/30/2021 0045   GFRNONAA >60 09/06/2021 0021   COAGS Lab Results  Component Value Date   INR 1.1 08/30/2021   Lipid Panel    Component Value Date/Time   CHOL 190 08/30/2021 0443   TRIG 41 08/30/2021 0443   HDL 59 08/30/2021 0443   CHOLHDL 3.2 08/30/2021 0443   VLDL 8 08/30/2021 0443   LDLCALC 123 (H) 08/30/2021 0443   HgbA1C  Lab Results  Component Value Date   HGBA1C 5.3 08/30/2021   Urinalysis No results found for: "COLORURINE", "APPEARANCEUR", "LABSPEC",  "PHURINE", "GLUCOSEU", "HGBUR", "BILIRUBINUR", "KETONESUR", "PROTEINUR", "UROBILINOGEN", "NITRITE", "LEUKOCYTESUR" Urine Drug Screen     Component Value Date/Time   LABOPIA NONE DETECTED 09/04/2021 0021   COCAINSCRNUR NONE DETECTED 09/04/2021 0021   LABBENZ NONE DETECTED 09/04/2021 0021   AMPHETMU NONE DETECTED 09/04/2021 0021   THCU NONE DETECTED 09/04/2021 0021   LABBARB NONE DETECTED 09/04/2021 0021    Alcohol Level    Component Value Date/Time   ETH <10 08/30/2021 0045     SIGNIFICANT DIAGNOSTIC STUDIES CARDIAC CATHETERIZATION  Result Date: 09/02/2021   There is severe left ventricular systolic dysfunction.   LV end diastolic pressure is moderately elevated.  LVEDP 33 mmHg.   The left ventricular ejection fraction is less than 25% by visual estimate.   Hemodynamic findings consistent with moderate pulmonary hypertension.   There is no aortic valve stenosis.   Aortic saturation 94%, PA saturation 62%, PA pressure 53/32, mean PA pressure 41 mmHg, mean pulmonary capillary wedge pressure 33 mmHg, cardiac output 4.61 L/min, cardiac index 2.41. No angiographically apparent coronary artery disease.  Elevated right heart pressures.  She is volume overloaded.  Continue with plans for diuresis.   ECHO TEE  Result Date: 09/02/2021    TRANSESOPHOGEAL ECHO REPORT   Patient Name:   Gina Nichols Date of Exam: 09/02/2021 Medical Rec #:  [id number]   Height:       65.0 in Accession #:    [id number]  Weight:       185.2 lb Date of Birth:  12/27/1970   BSA:          1.914 m Patient Age:    51 years    BP:           129/85 mmHg Patient Gender: F           HR:           109 bpm. Exam Location:  Inpatient Procedure: Transesophageal Echo, Cardiac Doppler and Color Doppler Indications:     Stroke  History:         Patient has prior history of Echocardiogram examinations, most                  recent 08/30/2021. Abnormal ECG; Stroke.  Sonographer:     Roseanna Rainbow RDCS Referring Phys:  [place]  Diagnosing Phys: Lyman Bishop MD PROCEDURE: After discussion of the risks and benefits of a TEE, an informed consent was obtained from the patient. The transesophogeal probe was passed without difficulty through the esophogus of the patient. Imaged were obtained with the patient in a supine position. Sedation performed by different physician. The patient was monitored while under deep  sedation. Anesthestetic sedation was provided intravenously by Anesthesiology: 69m of Propofol. The patient's vital signs; including heart rate, blood  pressure, and oxygen saturation; remained stable throughout the procedure. The patient developed no complications during the procedure. IMPRESSIONS  1. Left ventricular ejection fraction, by estimation, is <20%. The left ventricle has severely decreased function.  2. Right ventricular systolic function is normal. The right ventricular size is normal.  3. Left atrial size was moderately dilated. No left atrial/left atrial appendage thrombus was detected.  4. Right atrial size was mildly dilated.  5. The mitral valve is abnormal. Mild to moderate mitral valve regurgitation.  6. The aortic valve is tricuspid. Aortic valve regurgitation is not visualized.  7. Agitated saline contrast bubble study was negative, with no evidence of any interatrial shunt. Conclusion(s)/Recommendation(s): No LA/LAA thrombus identified. Negative bubble study for interatrial shunt. No intracardiac source of embolism detected on this on this transesophageal echocardiogram. FINDINGS  Left Ventricle: Left ventricular ejection fraction, by estimation, is <20%. The left ventricle has severely decreased function. The left ventricular internal cavity size was normal in size. Right Ventricle: The right ventricular size is normal. No increase in right ventricular wall thickness. Right ventricular systolic function is normal. Left Atrium: Left atrial size was moderately dilated. No left atrial/left atrial appendage  thrombus was detected. Right Atrium: Right atrial size was mildly dilated. Pericardium: There is no evidence of pericardial effusion. Mitral Valve: The mitral valve is abnormal. Mild to moderate mitral valve regurgitation, with centrally-directed jet. Tricuspid Valve: The tricuspid valve is grossly normal. Tricuspid valve regurgitation is mild. Aortic Valve: The aortic valve is tricuspid. Aortic valve regurgitation is not visualized. Pulmonic Valve: The pulmonic valve was grossly normal. Pulmonic valve regurgitation is trivial. Aorta: The aortic root and ascending aorta are structurally normal, with no evidence of dilitation. IAS/Shunts: No atrial level shunt detected by color flow Doppler. Agitated saline contrast was given intravenously to evaluate for intracardiac shunting. Agitated saline contrast bubble study was negative, with no evidence of any interatrial shunt. KLyman BishopMD Electronically signed by KLyman BishopMD Signature Date/Time: 09/02/2021/2:23:24 PM    Final    VAS [place]  Result Date: 09/02/2021 Carotid Arterial Duplex Study Patient Name:  SGIULIA HICKEY Date of Exam:   09/01/2021 Medical Rec #: [id number]   Accession #:    [id number] of Birth: 3Aug 30, 1972   Patient Gender: F Patient Age:   89years [facility] [place] Referring Phys: PRAMOD SETHI --------------------------------------------------------------------------------  Indications:       CVA. Risk Factors:      Hypertension, hyperlipidemia. Comparison Study:  No previous exams Performing Technologist: Jody Hill RVT, RDMS  Examination Guidelines: A complete evaluation includes B-mode imaging, spectral Doppler, color Doppler, and power Doppler as needed of all accessible portions of each vessel. Bilateral testing is considered an integral part of a complete examination. Limited examinations for reoccurring indications may be performed as noted.  Right Carotid Findings:  +----------+--------+--------+--------+------------------+--------+           PSV cm/sEDV cm/sStenosisPlaque DescriptionComments +----------+--------+--------+--------+------------------+--------+ CCA Prox  46      9                                          +----------+--------+--------+--------+------------------+--------+ CCA Distal36      18                                         +----------+--------+--------+--------+------------------+--------+  ICA Prox  26      13                                         +----------+--------+--------+--------+------------------+--------+ ICA Distal28      14                                         +----------+--------+--------+--------+------------------+--------+ ECA       49      16                                         +----------+--------+--------+--------+------------------+--------+ +----------+--------+-------+----------------+-------------------+           PSV cm/sEDV cmsDescribe        Arm Pressure (mmHG) +----------+--------+-------+----------------+-------------------+ [id number]             Multiphasic, WNL                    +----------+--------+-------+----------------+-------------------+ +---------+--------+--+--------+-+---------+ VertebralPSV cm/s19EDV cm/s7Antegrade +---------+--------+--+--------+-+---------+  Left Carotid Findings: +----------+--------+--------+--------+------------------+--------+           PSV cm/sEDV cm/sStenosisPlaque DescriptionComments +----------+--------+--------+--------+------------------+--------+ CCA Prox  58      17                                         +----------+--------+--------+--------+------------------+--------+ CCA Distal45      15                                         +----------+--------+--------+--------+------------------+--------+ ICA Prox  35      16                                          +----------+--------+--------+--------+------------------+--------+ ICA Distal42      18                                         +----------+--------+--------+--------+------------------+--------+ ECA       55      15                                         +----------+--------+--------+--------+------------------+--------+ +----------+--------+--------+----------------+-------------------+           PSV cm/sEDV cm/sDescribe        Arm Pressure (mmHG) +----------+--------+--------+----------------+-------------------+ [id number]              Multiphasic, WNL                    +----------+--------+--------+----------------+-------------------+ +---------+--------+--+--------+--+---------+ VertebralPSV cm/s27EDV cm/s13Antegrade +---------+--------+--+--------+--+---------+   Summary: Right Carotid: The extracranial vessels were near-normal with only minimal wall                thickening or plaque. Incidental: Multiple nodes visualized in  the right thyroid- one simple, cystic measuring 2.39 x 1.70 x                2.37 cm; one heterogenous, hypoechoic, with peripheral                hypervascularity, and measuring 1.8 x 1.3 x 1.4 cm (AP > width).                Dedicated thyroid imaging recommended. Left Carotid: The extracranial vessels were near-normal with only minimal wall               thickening or plaque. Vertebrals:  Bilateral vertebral arteries demonstrate antegrade flow. Subclavians: Normal flow hemodynamics were seen in bilateral subclavian              arteries. *See table(s) above for measurements and observations.  Electronically signed by Antony Contras MD on 09/02/2021 at 11:39:26 AM.    Final    DG CHEST PORT 1 VIEW  Result Date: 08/31/2021 CLINICAL DATA:  Dyspnea EXAM: PORTABLE CHEST 1 VIEW COMPARISON:  None Available. FINDINGS: Cardiac silhouette is mildly to moderately enlarged. Mediastinal contours are within normal limits. The lungs clear. No  pleural effusion or pneumothorax. Oral contrast overlies the distal transverse and proximal descending colon. Mild multilevel degenerative disc changes of the thoracic spine. Mild dextrocurvature of the midthoracic spine. IMPRESSION: Mild-to-moderate enlargement of the cardiac silhouette. No acute lung process. Electronically Signed   By: Yvonne Kendall M.D.   On: 08/31/2021 18:21   ECHOCARDIOGRAM COMPLETE  Result Date: 08/30/2021    ECHOCARDIOGRAM REPORT   Patient Name:   ZYANA AMARO Date of Exam: 08/30/2021 Medical Rec #:  [id number]   Height: Accession #:    [id number]  Weight:       185.2 lb Date of Birth:  [birth year]   BSA:          1.918 m Patient Age:    74 years    BP:           102/67 mmHg Patient Gender: F           HR:           81 bpm. Exam Location:  Inpatient Procedure: 2D Echo, Cardiac Doppler and Color Doppler Indications:    Stroke  History:        Patient has no prior history of Echocardiogram examinations.  Sonographer:    Jefferey Pica Referring Phys: [place]  1. Left ventricular ejection fraction, by estimation, is 20 to 25%. The left ventricle has severely decreased function. The left ventricle demonstrates global hypokinesis. The left ventricular internal cavity size was moderately dilated. Left ventricular diastolic parameters are indeterminate.  2. Right ventricular systolic function is normal. The right ventricular size is normal. There is normal pulmonary artery systolic pressure.  3. Left atrial size was moderately dilated.  4. Right atrial size was mildly dilated.  5. The mitral valve is normal in structure. Mild mitral valve regurgitation. No evidence of mitral stenosis.  6. The aortic valve is normal in structure. Aortic valve regurgitation is not visualized. No aortic stenosis is present.  7. Aortic dilatation noted. There is borderline dilatation of the ascending aorta, measuring 38 mm.  8. The inferior vena cava is normal in size with greater than 50%  respiratory variability, suggesting right atrial pressure of 3 mmHg. FINDINGS  Left Ventricle: Left ventricular ejection fraction, by estimation, is 20 to 25%. The left ventricle has severely decreased function. The  left ventricle demonstrates global hypokinesis. The left ventricular internal cavity size was moderately dilated. There is no left ventricular hypertrophy. Left ventricular diastolic parameters are indeterminate. Right Ventricle: The right ventricular size is normal. No increase in right ventricular wall thickness. Right ventricular systolic function is normal. There is normal pulmonary artery systolic pressure. The tricuspid regurgitant velocity is 2.29 m/s, and  with an assumed right atrial pressure of 10 mmHg, the estimated right ventricular systolic pressure is 91.7 mmHg. Left Atrium: Left atrial size was moderately dilated. Right Atrium: Right atrial size was mildly dilated. Pericardium: There is no evidence of pericardial effusion. Mitral Valve: The mitral valve is normal in structure. Mild mitral valve regurgitation. No evidence of mitral valve stenosis. Tricuspid Valve: The tricuspid valve is normal in structure. Tricuspid valve regurgitation is mild . No evidence of tricuspid stenosis. Aortic Valve: The aortic valve is normal in structure. Aortic valve regurgitation is not visualized. No aortic stenosis is present. Aortic valve peak gradient measures 5.2 mmHg. Pulmonic Valve: The pulmonic valve was normal in structure. Pulmonic valve regurgitation is trivial. No evidence of pulmonic stenosis. Aorta: The aortic root is normal in size and structure and aortic dilatation noted. There is borderline dilatation of the ascending aorta, measuring 38 mm. Venous: The inferior vena cava is normal in size with greater than 50% respiratory variability, suggesting right atrial pressure of 3 mmHg. IAS/Shunts: No atrial level shunt detected by color flow Doppler.  LEFT VENTRICLE PLAX 2D LVIDd:         5.40 cm    Diastology LVIDs:         4.90 cm   LV e' lateral:   7.49 cm/s LV PW:         0.90 cm   LV E/e' lateral: 11.5 LV IVS:        0.90 cm LVOT diam:     2.10 cm LV SV:         72 LV SV Index:   37 LVOT Area:     3.46 cm  RIGHT VENTRICLE             IVC RV Basal diam:  3.10 cm     IVC diam: 2.00 cm RV S prime:     12.80 cm/s TAPSE (M-mode): 1.8 cm LEFT ATRIUM             Index        RIGHT ATRIUM           Index LA diam:        4.60 cm 2.40 cm/m   RA Area:     17.40 cm LA Vol (A2C):   78.7 ml 41.03 ml/m  RA Volume:   48.20 ml  25.13 ml/m LA Vol (A4C):   85.1 ml 44.36 ml/m LA Biplane Vol: 87.6 ml 45.67 ml/m  AORTIC VALVE                 PULMONIC VALVE AV Area (Vmax): 3.78 cm     PV Vmax:       0.79 m/s AV Vmax:        114.50 cm/s  PV Peak grad:  2.5 mmHg AV Peak Grad:   5.2 mmHg LVOT Vmax:      125.00 cm/s LVOT Vmean:     82.000 cm/s LVOT VTI:       0.207 m  AORTA Ao Root diam: 3.30 cm Ao Asc diam:  3.80 cm MITRAL VALVE  TRICUSPID VALVE MV Area (PHT): 6.96 cm    TR Peak grad:   21.0 mmHg MV Decel Time: 109 msec    TR Vmax:        229.00 cm/s MV E velocity: 85.90 cm/s MV A velocity: 43.70 cm/s  SHUNTS MV E/A ratio:  1.97        Systemic VTI:  0.21 m                            Systemic Diam: 2.10 cm Glori Bickers MD Electronically signed by Glori Bickers MD Signature Date/Time: 08/30/2021/3:40:25 PM    Final    DG Swallowing Func-Speech Pathology  Result Date: 08/30/2021 Table formatting from the original result was not included. Objective Swallowing Evaluation: Type of Study: MBS-Modified Barium Swallow Study  Patient Details Name: Carine Nordgren MRN: [id number] Date of Birth: 26-Jan-1971 Today's Date: 08/30/2021 Time: SLP Start Time (ACUTE ONLY): 3244 -SLP Stop Time (ACUTE ONLY): 1315 SLP Time Calculation (min) (ACUTE ONLY): 18 min Past Medical History: No past medical history on file. Past Surgical History: No past surgical history on file. HPI: Pt is a 51 y.o. female who presented with acute onset  of right sided weakness, facial droop and aphasia. 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 CTA 6/19: Occlusion of the proximal left M2, with possible distal reconstitution but relatively poor perfusion in the left MCA territory. Pt s/p mechanical thrombectomy 6/19. Pt failed [facility] 6/19 since she stopped drinking. PMH: craniotomy for left cerebral hemisphere intraparenchymal brain tumor resection 20 years ago, recent MRI brain with "spot on the brain" unchanged from 2012 but new since the resection surgery, prediabetes resolved s/p gastric surgery.  No data recorded  Recommendations for follow up therapy are one component of a multi-disciplinary discharge planning process, led by the attending physician.  Recommendations may be updated based on patient status, additional functional criteria and insurance authorization. Assessment / Plan / Recommendation   08/30/2021   2:31 PM Clinical Impressions Clinical Impression Pt was seen in radiology suite for modified barium swallow study. Trials of puree solids, regular texture solids, a 87m barium tablet, and thin liquids via cup and straw were administered. Pt's oropharyngeal swallow mechanism was within functional limits with mild oral which pt stated is her baseline. Throat clearing was noted occasionally during the study; however, no instances of penetration or aspiration were demonstrated even when her swallow was challenged with large boluses and consecutive swallows. It is recommended that a regular texture diet with thin liquids be initiated at this time and SLP will follow briefly to ensure tolerance. SLP Visit Diagnosis Dysphagia, unspecified (R13.10) Impact on safety and function Mild aspiration risk      08/30/2021   2:31 PM Treatment Recommendations Treatment Recommendations Therapy as outlined in treatment plan below     08/30/2021   2:31 PM Prognosis Prognosis for Safe Diet Advancement Good Barriers to Reach Goals Language deficits   08/30/2021   2:31 PM Diet Recommendations SLP Diet Recommendations Regular solids;Thin liquid Liquid Administration via Cup;Straw Medication Administration Whole meds with liquid Compensations Minimize environmental distractions Postural Changes Seated upright at 90 degrees     08/30/2021   2:31 PM Other Recommendations Oral Care Recommendations Oral care BID Follow Up Recommendations --  Assistance recommended at discharge Frequent or constant Supervision/Assistance Functional Status Assessment Patient has had a recent decline in their functional status and demonstrates the ability to make significant improvements in function in a reasonable and predictable amount of time.   08/30/2021   2:31 PM Frequency and Duration  Speech Therapy Frequency (ACUTE ONLY) min 2x/week Treatment Duration 2 weeks     08/30/2021   2:31 PM Oral Phase Oral Phase [facility]    08/30/2021   2:31 PM Pharyngeal Phase Pharyngeal Phase [facility]    08/30/2021   2:31 PM Cervical Esophageal Phase  Cervical Esophageal Phase [facility] Shanika I. Hardin Negus, [place], [place] Office number [phone] Pager [place] 08/30/2021, 3:25 PM                     MR BRAIN WO CONTRAST  Result Date: 08/30/2021 CLINICAL DATA:  Stroke, follow-up. EXAM: MRI HEAD WITHOUT CONTRAST TECHNIQUE: Multiplanar, multiecho pulse sequences of the brain and surrounding structures were obtained without intravenous contrast. COMPARISON:  CT head without contrast and CTA head and neck 08/30/2021 FINDINGS: Brain: Left frontal craniotomy noted with subjacent encephalomalacia. Focal restricted diffusion is present in the distal left ACA territory, superior and posterior to the resection cavity. Two punctate areas of  restricted diffusion are present in the more inferior anterior left frontal lobe. A punctate focus of restricted diffusion is present in the posterior watershed on the left. A punctate focus of restricted diffusion is present in the distal right ACA territory on image 33 of series 2. These foci are confirmed on the coronal diffusion sequence. A nodule along the lateral aspect of the right lateral ventricle adjacent to the resection cavity measures 19 x 8 x 9 mm. This lesion demonstrates restricted diffusion. Ex vacuo dilation of the left lateral ventricle is noted. Periventricular T2 hyperintensities are present bilaterally. Cortical T2 hyperintensity is present the posterior left operculum and sylvian fissure. No significant extraaxial fluid collection is present. The brainstem and cerebellum are within normal limits. The internal auditory canals are within normal limits. Vascular: Flow is present in the major intracranial arteries. Skull and upper cervical spine: The craniocervical junction is normal. Upper cervical spine is within normal limits. Marrow signal is unremarkable. Sinuses/Orbits: The paranasal sinuses and mastoid air cells are clear. Scratched at the right frontal sinus is opacified. The paranasal sinuses and mastoid air cells are otherwise clear. The globes and orbits are within normal limits. Other: IMPRESSION: 1. 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 2. Two punctate areas of acute/subacute nonhemorrhagic infarction involving the more inferior anterior left frontal lobe and left posterior watershed. 3. Punctate acute/subacute nonhemorrhagic infarcts in the posterior left watershed as well as the posterior right ACA territory. 4. 19 x 8 x 9 mm nodule along the lateral aspect of the right lateral ventricle adjacent to the resection cavity is consistent with a meningioma. 5. Periventricular and subcortical  white matter disease bilaterally is mildly advanced for age. This likely reflects the sequela of chronic microvascular ischemia. 6. Right frontal sinus disease. Electronically Signed   By: San Morelle M.D.   On: 08/30/2021 11:53   IR PERCUTANEOUS ART THROMBECTOMY/INFUSION INTRACRANIAL INC DIAG ANGIO  Result Date: 08/30/2021 INDICATION: Reeda Soohoo is a 51 year old female presenting with acute onset of right sided weakness, facial droop and aphasia; NIHSS 20. Her last known well was 11 p.m. on 08/29/2021. 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 Per sister, over the past 3 weeks the patient has had some developing edema of her lower extremities. Head CT was performed showing no evidence of large evolving infarct or hemorrhage. Postsurgical changes are noted in the left frontal lobe with small intraventricular mass lesion. CT angiogram of the head and neck showed an occlusion of the proximal left M2/MCA posterior division branch with intermediate leptomeningeal collaterals. She was taken to our service for emergency mechanical thrombectomy. EXAM: ULTRASOUND-GUIDED VASCULAR ACCESS DIAGNOSTIC CEREBRAL ANGIOGRAM MECHANICAL THROMBECTOMY FLAT PANEL HEAD CT COMPARISON:  CT/CT angiogram of the head and neck August 30, 2021. MEDICATIONS: No antibiotics administered. ANESTHESIA/SEDATION: The procedure was performed in the general anesthesia. CONTRAST:  75 mL of Omnipaque 300 milligram/mL. FLUOROSCOPY: Radiation Exposure Index (as provided by the fluoroscopic device): 163.8 mGy Kerma COMPLICATIONS: SIR Level A - No therapy, no consequence. TECHNIQUE: Informed written consent was obtained from the patient's mother after a thorough discussion of the procedural risks, benefits and alternatives. All questions were addressed. Maximal Sterile Barrier Technique was utilized including caps, mask, sterile gowns, sterile  gloves, sterile drape, hand hygiene and skin antiseptic. A timeout was performed prior to the initiation of the procedure. The right groin was prepped and draped in the usual sterile fashion. Using a micropuncture kit and the modified Seldinger technique, access was gained to the right common femoral artery and an 8 French sheath was placed. Real-time ultrasound guidance was utilized for vascular access including the acquisition of a permanent ultrasound image documenting patency of the accessed vessel. Under fluoroscopy, a Zoom 88 guide catheter was navigated over a 6 [place] VTK catheter and a 0.035" Terumo Glidewire into the aortic arch. The catheter was placed into the left common carotid artery and then advanced into the left internal carotid artery. The diagnostic catheter was removed. Frontal and lateral angiograms of the head were obtained. FINDINGS: 1. Normal caliber of the right common femoral artery, adequate for vascular access. 2. Occlusion of the proximal M2/MCA posterior division branch. 3. 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 PROCEDURE: Using biplane roadmap, a 5 [place] Esperance aspiration catheter was navigated over an Aristotle 24 microguidewire into the cavernous segment of the left ICA. The aspiration catheter was then advanced to the level of occlusion and connected to an aspiration pump. Continuous aspiration was performed for 2 minutes. The guide catheter was connected to a VacLok syringe. The aspiration catheter was subsequently removed under constant aspiration. The guide catheter was aspirated for debris. Left internal carotid artery angiograms with frontal views showed recanalization of the M2 segment with and occlusive embolus to distal M3 and a nonocclusive embolus to mid M3 segment (TICI 2B). Using biplane roadmap, a Zoom 35 aspiration catheter was navigated over an Aristotle 24 microguidewire into the cavernous segment of the left ICA. The  aspiration catheter was then advanced to the mid M3 segment with nonocclusive filling defect and connected to an aspiration pump. Continuous aspiration was performed for 2 minutes. The guide catheter was connected to a VacLok syringe. The aspiration catheter was subsequently removed under constant aspiration. The guide catheter was aspirated for debris. Left internal carotid artery angiograms with frontal views showed persistent occlusive embolus to distal M3 and a nonocclusive embolus to mid M3 segment (TICI 2B). Using biplane roadmap, a Zoom 35 aspiration catheter was navigated over an Aristotle 24 microguidewire into the cavernous segment of the left ICA. The aspiration catheter  was then advanced to the level of occlusion in the distal M3 segment and connected to an aspiration pump. Continuous aspiration was performed for 2 minutes. The guide catheter was connected to a VacLok syringe. The aspiration catheter was subsequently removed under constant aspiration. The guide catheter was aspirated for debris. Left internal carotid artery angiograms with frontal views showed recanalization of the previously occluded distal M3 branch with persistent nonocclusive embolus to mid M3 segment (TICI 3). Using biplane roadmap, a 5 [place] Esperance aspiration catheter was navigated over an Aristotle 24 microguidewire into the cavernous segment of the left ICA. The aspiration catheter was then advanced to the mid M3 segment with nonocclusive filling defect and connected to an aspiration pump. Continuous aspiration was performed for 2 minutes. The guide catheter was connected to a VacLok syringe. The aspiration catheter was subsequently removed under constant aspiration. The guide catheter was aspirated for debris. Left internal carotid artery angiograms with frontal lateral views of the head showed a resolution of the nonocclusive filling defect in the mid M3 segment. Flat panel CT of the head was obtained and post processed in  a separate workstation with concurrent attending physician supervision. Selected images were sent to PACS. Trace hyperdensity in the left sylvian fissure there is noted, may represent subarachnoid hemorrhage versus contrast extravasation. Enhancement of the intraventricular mass lesion is also noted. Right common femoral artery angiogram was obtained in right anterior oblique view. The puncture is at the level of the common femoral artery. The artery has normal caliber, adequate for closure device. The sheath was exchanged over the wire for a Perclose prostyle which was utilized for access closure. Immediate hemostasis was achieved. IMPRESSION: 1. Successful mechanical thrombectomy with direct contact aspiration for treatment of the proximal left M2/MCA posterior division branch occlusion achieving complete recanalization (TICI 3). 2. Trace hyperdensity in the left the fissure may represent minor subarachnoid hemorrhage versus contrast extravasation. PLAN: Transfer to ICU. Electronically Signed   By: Pedro Earls M.D.   On: 08/30/2021 09:45   IR US Guide Vasc Access Right  Result Date: 08/30/2021 INDICATION: Deysha Cartier is a 51 year old female presenting with acute onset of right sided weakness, facial droop and aphasia; NIHSS 20. Her last known well was 11 p.m. on 08/29/2021. 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 Per sister, over the past 3 weeks the patient has had some developing edema of her lower extremities. Head CT was performed showing no evidence of large evolving infarct or hemorrhage. Postsurgical changes are noted in the left frontal lobe with small intraventricular mass lesion. CT angiogram of the head and neck showed an occlusion of the proximal left M2/MCA posterior division branch with intermediate leptomeningeal collaterals. She was taken to our service for emergency  mechanical thrombectomy. EXAM: ULTRASOUND-GUIDED VASCULAR ACCESS DIAGNOSTIC CEREBRAL ANGIOGRAM MECHANICAL THROMBECTOMY FLAT PANEL HEAD CT COMPARISON:  CT/CT angiogram of the head and neck August 30, 2021. MEDICATIONS: No antibiotics administered. ANESTHESIA/SEDATION: The procedure was performed in the general anesthesia. CONTRAST:  75 mL of Omnipaque 300 milligram/mL. FLUOROSCOPY: Radiation Exposure Index (as provided by the fluoroscopic device): 016.0 mGy Kerma COMPLICATIONS: SIR Level A - No therapy, no consequence. TECHNIQUE: Informed written consent was obtained from the patient's mother after a thorough discussion of the procedural risks, benefits and alternatives. All questions were addressed. Maximal Sterile Barrier Technique was utilized including caps, mask, sterile gowns, sterile gloves, sterile drape, hand hygiene and skin antiseptic. A  timeout was performed prior to the initiation of the procedure. The right groin was prepped and draped in the usual sterile fashion. Using a micropuncture kit and the modified Seldinger technique, access was gained to the right common femoral artery and an 8 French sheath was placed. Real-time ultrasound guidance was utilized for vascular access including the acquisition of a permanent ultrasound image documenting patency of the accessed vessel. Under fluoroscopy, a Zoom 88 guide catheter was navigated over a 6 [place] VTK catheter and a 0.035" Terumo Glidewire into the aortic arch. The catheter was placed into the left common carotid artery and then advanced into the left internal carotid artery. The diagnostic catheter was removed. Frontal and lateral angiograms of the head were obtained. FINDINGS: 1. Normal caliber of the right common femoral artery, adequate for vascular access. 2. Occlusion of the proximal M2/MCA posterior division branch. 3. 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 PROCEDURE: Using biplane roadmap, a 5 [place]  Esperance aspiration catheter was navigated over an Aristotle 24 microguidewire into the cavernous segment of the left ICA. The aspiration catheter was then advanced to the level of occlusion and connected to an aspiration pump. Continuous aspiration was performed for 2 minutes. The guide catheter was connected to a VacLok syringe. The aspiration catheter was subsequently removed under constant aspiration. The guide catheter was aspirated for debris. Left internal carotid artery angiograms with frontal views showed recanalization of the M2 segment with and occlusive embolus to distal M3 and a nonocclusive embolus to mid M3 segment (TICI 2B). Using biplane roadmap, a Zoom 35 aspiration catheter was navigated over an Aristotle 24 microguidewire into the cavernous segment of the left ICA. The aspiration catheter was then advanced to the mid M3 segment with nonocclusive filling defect and connected to an aspiration pump. Continuous aspiration was performed for 2 minutes. The guide catheter was connected to a VacLok syringe. The aspiration catheter was subsequently removed under constant aspiration. The guide catheter was aspirated for debris. Left internal carotid artery angiograms with frontal views showed persistent occlusive embolus to distal M3 and a nonocclusive embolus to mid M3 segment (TICI 2B). Using biplane roadmap, a Zoom 35 aspiration catheter was navigated over an Aristotle 24 microguidewire into the cavernous segment of the left ICA. The aspiration catheter was then advanced to the level of occlusion in the distal M3 segment and connected to an aspiration pump. Continuous aspiration was performed for 2 minutes. The guide catheter was connected to a VacLok syringe. The aspiration catheter was subsequently removed under constant aspiration. The guide catheter was aspirated for debris. Left internal carotid artery angiograms with frontal views showed recanalization of the previously occluded distal M3 branch  with persistent nonocclusive embolus to mid M3 segment (TICI 3). Using biplane roadmap, a 5 [place] Esperance aspiration catheter was navigated over an Aristotle 24 microguidewire into the cavernous segment of the left ICA. The aspiration catheter was then advanced to the mid M3 segment with nonocclusive filling defect and connected to an aspiration pump. Continuous aspiration was performed for 2 minutes. The guide catheter was connected to a VacLok syringe. The aspiration catheter was subsequently removed under constant aspiration. The guide catheter was aspirated for debris. Left internal carotid artery angiograms with frontal lateral views of the head showed a resolution of the nonocclusive filling defect in the mid M3 segment. Flat panel CT of the head was obtained and post processed in a separate workstation with concurrent attending physician supervision. Selected images were  sent to PACS. Trace hyperdensity in the left sylvian fissure there is noted, may represent subarachnoid hemorrhage versus contrast extravasation. Enhancement of the intraventricular mass lesion is also noted. Right common femoral artery angiogram was obtained in right anterior oblique view. The puncture is at the level of the common femoral artery. The artery has normal caliber, adequate for closure device. The sheath was exchanged over the wire for a Perclose prostyle which was utilized for access closure. Immediate hemostasis was achieved. IMPRESSION: 1. Successful mechanical thrombectomy with direct contact aspiration for treatment of the proximal left M2/MCA posterior division branch occlusion achieving complete recanalization (TICI 3). 2. Trace hyperdensity in the left the fissure may represent minor subarachnoid hemorrhage versus contrast extravasation. PLAN: Transfer to ICU. Electronically Signed   By: Pedro Earls M.D.   On: 08/30/2021 09:45   IR CT Head Ltd  Result Date: 08/30/2021 INDICATION: Nell Gales is a  51 year old female presenting with acute onset of right sided weakness, facial droop and aphasia; NIHSS 20. Her last known well was 11 p.m. on 08/29/2021. 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 Per sister, over the past 3 weeks the patient has had some developing edema of her lower extremities. Head CT was performed showing no evidence of large evolving infarct or hemorrhage. Postsurgical changes are noted in the left frontal lobe with small intraventricular mass lesion. CT angiogram of the head and neck showed an occlusion of the proximal left M2/MCA posterior division branch with intermediate leptomeningeal collaterals. She was taken to our service for emergency mechanical thrombectomy. EXAM: ULTRASOUND-GUIDED VASCULAR ACCESS DIAGNOSTIC CEREBRAL ANGIOGRAM MECHANICAL THROMBECTOMY FLAT PANEL HEAD CT COMPARISON:  CT/CT angiogram of the head and neck August 30, 2021. MEDICATIONS: No antibiotics administered. ANESTHESIA/SEDATION: The procedure was performed in the general anesthesia. CONTRAST:  75 mL of Omnipaque 300 milligram/mL. FLUOROSCOPY: Radiation Exposure Index (as provided by the fluoroscopic device): 885.0 mGy Kerma COMPLICATIONS: SIR Level A - No therapy, no consequence. TECHNIQUE: Informed written consent was obtained from the patient's mother after a thorough discussion of the procedural risks, benefits and alternatives. All questions were addressed. Maximal Sterile Barrier Technique was utilized including caps, mask, sterile gowns, sterile gloves, sterile drape, hand hygiene and skin antiseptic. A timeout was performed prior to the initiation of the procedure. The right groin was prepped and draped in the usual sterile fashion. Using a micropuncture kit and the modified Seldinger technique, access was gained to the right common femoral artery and an 8 French sheath was placed. Real-time ultrasound  guidance was utilized for vascular access including the acquisition of a permanent ultrasound image documenting patency of the accessed vessel. Under fluoroscopy, a Zoom 88 guide catheter was navigated over a 6 [place] VTK catheter and a 0.035" Terumo Glidewire into the aortic arch. The catheter was placed into the left common carotid artery and then advanced into the left internal carotid artery. The diagnostic catheter was removed. Frontal and lateral angiograms of the head were obtained. FINDINGS: 1. Normal caliber of the right common femoral artery, adequate for vascular access. 2. Occlusion of the proximal M2/MCA posterior division branch. 3. 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 PROCEDURE: Using biplane roadmap, a 5 [place] Esperance aspiration catheter was navigated over an Aristotle 24 microguidewire into the cavernous segment of the left ICA. The aspiration catheter was then advanced to the level of occlusion and connected to an aspiration pump.  Continuous aspiration was performed for 2 minutes. The guide catheter was connected to a VacLok syringe. The aspiration catheter was subsequently removed under constant aspiration. The guide catheter was aspirated for debris. Left internal carotid artery angiograms with frontal views showed recanalization of the M2 segment with and occlusive embolus to distal M3 and a nonocclusive embolus to mid M3 segment (TICI 2B). Using biplane roadmap, a Zoom 35 aspiration catheter was navigated over an Aristotle 24 microguidewire into the cavernous segment of the left ICA. The aspiration catheter was then advanced to the mid M3 segment with nonocclusive filling defect and connected to an aspiration pump. Continuous aspiration was performed for 2 minutes. The guide catheter was connected to a VacLok syringe. The aspiration catheter was subsequently removed under constant aspiration. The guide catheter was aspirated for debris. Left  internal carotid artery angiograms with frontal views showed persistent occlusive embolus to distal M3 and a nonocclusive embolus to mid M3 segment (TICI 2B). Using biplane roadmap, a Zoom 35 aspiration catheter was navigated over an Aristotle 24 microguidewire into the cavernous segment of the left ICA. The aspiration catheter was then advanced to the level of occlusion in the distal M3 segment and connected to an aspiration pump. Continuous aspiration was performed for 2 minutes. The guide catheter was connected to a VacLok syringe. The aspiration catheter was subsequently removed under constant aspiration. The guide catheter was aspirated for debris. Left internal carotid artery angiograms with frontal views showed recanalization of the previously occluded distal M3 branch with persistent nonocclusive embolus to mid M3 segment (TICI 3). Using biplane roadmap, a 5 [place] Esperance aspiration catheter was navigated over an Aristotle 24 microguidewire into the cavernous segment of the left ICA. The aspiration catheter was then advanced to the mid M3 segment with nonocclusive filling defect and connected to an aspiration pump. Continuous aspiration was performed for 2 minutes. The guide catheter was connected to a VacLok syringe. The aspiration catheter was subsequently removed under constant aspiration. The guide catheter was aspirated for debris. Left internal carotid artery angiograms with frontal lateral views of the head showed a resolution of the nonocclusive filling defect in the mid M3 segment. Flat panel CT of the head was obtained and post processed in a separate workstation with concurrent attending physician supervision. Selected images were sent to PACS. Trace hyperdensity in the left sylvian fissure there is noted, may represent subarachnoid hemorrhage versus contrast extravasation. Enhancement of the intraventricular mass lesion is also noted. Right common femoral artery angiogram was obtained in right  anterior oblique view. The puncture is at the level of the common femoral artery. The artery has normal caliber, adequate for closure device. The sheath was exchanged over the wire for a Perclose prostyle which was utilized for access closure. Immediate hemostasis was achieved. IMPRESSION: 1. Successful mechanical thrombectomy with direct contact aspiration for treatment of the proximal left M2/MCA posterior division branch occlusion achieving complete recanalization (TICI 3). 2. Trace hyperdensity in the left the fissure may represent minor subarachnoid hemorrhage versus contrast extravasation. PLAN: Transfer to ICU. Electronically Signed   By: Pedro Earls M.D.   On: 08/30/2021 09:45   CT ANGIO HEAD NECK W WO CM (CODE STROKE)  Result Date: 08/30/2021 CLINICAL DATA:  Right-sided weakness, dysphagia EXAM: CT ANGIOGRAPHY HEAD AND NECK TECHNIQUE: Multidetector CT imaging of the head and neck was performed using the standard protocol during bolus administration of intravenous contrast. Multiplanar CT image reconstructions and MIPs were obtained to evaluate the vascular anatomy. Carotid  stenosis measurements (when applicable) are obtained utilizing NASCET criteria, using the distal internal carotid diameter as the denominator. RADIATION DOSE REDUCTION: This exam was performed according to the departmental dose-optimization program which includes automated exposure control, adjustment of the mA and/or kV according to patient size and/or use of iterative reconstruction technique. CONTRAST:  58m OMNIPAQUE IOHEXOL 350 MG/ML SOLN COMPARISON:  No prior CTA, correlation is made with CT head 08/30/2021 FINDINGS: CT HEAD FINDINGS For noncontrast findings, please see same day CT head. CTA NECK FINDINGS Aortic arch: Two-vessel arch with a common origin of the brachiocephalic and left common carotid arteries. Imaged portion shows no evidence of aneurysm or dissection. No significant stenosis of the major arch  vessel origins. Right carotid system: No evidence of dissection, occlusion, or hemodynamically significant stenosis (greater than 50%). Left carotid system: No evidence of dissection, occlusion, or hemodynamically significant stenosis (greater than 50%). Vertebral arteries: No evidence of dissection, occlusion, or hemodynamically significant stenosis (greater than 50%). Skeleton: No acute osseous abnormality. Degenerative changes in the cervical spine. Other neck: Hypoenhancing nodule in the right thyroid lobe measures up to 2.2 cm. Upper chest: No focal pulmonary opacity or pleural effusion. Review of the MIP images confirms the above findings CTA HEAD FINDINGS Anterior circulation: Both internal carotid arteries are patent to the termini, without significant stenosis. A1 segments patent. Normal anterior communicating artery. Anterior cerebral arteries are patent to their distal aspects. No M1 stenosis or occlusion. Normal MCA bifurcations. Occlusion of the left M2 (series 7, image 103), with distal reconstitution of several branches but relatively poor perfusion in the left MCA territory. Distal right MCA branches are well perfused. Posterior circulation: Vertebral arteries patent to the vertebrobasilar junction without stenosis. Basilar patent to its distal aspect. Superior cerebellar arteries patent proximally. Patent P1 segments. PCAs perfused to their distal aspects without stenosis. The bilateral posterior communicating arteries are patent. Venous sinuses: As permitted by contrast timing, patent. Anatomic variants: None significant. Review of the MIP images confirms the above findings IMPRESSION: 1. Occlusion of the proximal left M2, with possible distal reconstitution but relatively poor perfusion in the left MCA territory. 2. No other intracranial large vessel occlusion or hemodynamically significant stenosis. 3.  No hemodynamically significant stenosis in the neck. 4. Hypoenhancing nodule in the right  thyroid lobe, which measures up to 2.2 cm. If this has not previously been evaluated, a non-emergent ultrasound of the thyroid is recommended. (Reference: J Am Coll Radiol. 2015 Feb;12(2): 143-50) Code stroke imaging results were communicated on 08/30/2021 at 1:06 am to provider Dr. LCheral Markervia secure text paging. Electronically Signed   By: AMerilyn BabaM.D.   On: 08/30/2021 01:22   CT HEAD CODE STROKE WO CONTRAST  Result Date: 08/30/2021 CLINICAL DATA:  Code stroke. EXAM: CT HEAD WITHOUT CONTRAST TECHNIQUE: Contiguous axial images were obtained from the base of the skull through the vertex without intravenous contrast. RADIATION DOSE REDUCTION: This exam was performed according to the departmental dose-optimization program which includes automated exposure control, adjustment of the mA and/or kV according to patient size and/or use of iterative reconstruction technique. COMPARISON:  None Available. FINDINGS: Brain: Encephalomalacia in the left frontal lobe, which appears postsurgical. Somewhat hyperdense tissue along the lateral aspect of the left lateral ventricle/resection cavity, which measures approximately 12 x 10 x 19 mm (series 3, image 17 and series 5, image 32). No evidence of acute infarction, hemorrhage, cerebral edema, mass effect, or midline shift. No hydrocephalus or extra-axial collection. Vascular: No hyperdense vessel. Skull: Prior bifrontal  craniotomies. Hyperostosis frontalis. Negative for fracture or focal lesion. Sinuses/Orbits: Mucosal thickening in the right frontal sinus and right ethmoid air cells. The orbits are unremarkable. Other: The mastoid air cells are well aerated. ASPECTS [facility] Stroke Program Early CT Score) - Ganglionic level infarction (caudate, lentiform nuclei, internal capsule, insula, M1-M3 cortex): 7 - Supraganglionic infarction (M4-M6 cortex): 3 Total score (0-10 with 10 being normal): 10 IMPRESSION: 1. Postsurgical changes in the left frontal lobe with  encephalomalacia and a small somewhat hyperdense mass along the lateral aspect of the left ventricle/resection cavity, which is indeterminate but could represent residual or recurrent tumor, if the postoperative findings are related to tumor resection. Comparison with prior imaging would be helpful. Correlate with history. 2. No evidence of acute infarct or hemorrhage. 3. ASPECTS is 10 Code stroke imaging results were communicated on 08/30/2021 at 12:57 am to provider Dr. Cheral Marker via secure text paging. Electronically Signed   By: Merilyn Baba M.D.   On: 08/30/2021 00:57      HISTORY OF PRESENT ILLNESS  Zakaria Fromer is an 51 y.o. 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 LKN was 11:00 PM when she went to bed. She was then checked on by family who noted the above symptoms. EMS was called and on arrival they noted same. BP was 130/80 and CBG was normal. Patient was not giving any indication that she was in pain. Family did not endorse seeing any seizure like activity. No evidence for tongue biting or incontinence per EMS. Per sister, over the past 3 weeks the patient has had some developing edema of her lower extremities. She is seen in follow up by a Neurologist at [facility]. Her left sided brain tumor also was resected at [facility]. Family does not recall the name/type of the tumor.    HOSPITAL COURSE Ms. Kyrie Bun is a 51 y.o. 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    Stroke :  left MCA multifocal small/punctate infarcts due to left M1/M2 occlusion s/p IR with TICI3 and trace sylvian fissure SAH, likely from new diagnosis of cardiomyopathy with low EF CT head: Postsurgical changes in the left frontal lobe with encephalomalacia and a small somewhat hyperdense mass along the lateral aspect of the left ventricle/resection cavity. ASPECTS 10 CTA of head and neck: Occlusion of the proximal left M2, with possible distal reconstitution but relatively poor perfusion in the left MCA territory.  IR left M1/M2 occlusion with distal embolus, achieving TICI3  Post IR CT Trace SAH vs contrast extravasation in the left Sylvian fissure  MRI patchy left hemispheric infarcts involving frontal lobes ACA/MCA watershed Carotid Ultrasound unremarkable 2D Echo EF 20 to 25%  TEE no clot or PFO. EF 20% Consider 30 day monitor to assess arrhythmia on discharge. LDL 123 HgbA1c 5.3 VTE prophylaxis - Eliquis No antithrombotic prior to admission, now on eliquis given low EF in the setting of embolic stroke.  Therapy recommendations - outpatient OT Disposition - pending   Cardiomyopathy New diagnosis- follow up with heart failure clinic outpatient Cardiac cath negative for CAD 2D Echo EF 20 to 25%  TEE no clot or PFO. EF 20% Cardiology Dr. 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 rate 18, height _0  (1.651 m), weight 82 kg, SpO2 100 %.  Physical Exam  HEENT-  Normocephalic with evidence for prior left craniotomy  Lungs- Respirations unlabored Extremities- No edema  Mental Status: Awake and alert.  Mild expressive aphasia.  Frequent word repeating and slight hesitancy in her speech.  Able to follow all verbal commands, no dysarthria Cranial Nerves: II: Visual fields full III,IV, VI: No disconjugate eyes, no nystagmus.  V: Brisk response to eyelash stimulation on the left. Weak response to eyelash stimulation on the right.  VII: Facial symmetrical VIII: Hearing intact to voice IX,X: Unremarkable XI: Shoulder shrug symmetrical XII: Midline tongue extension Motor: LUE and LLE 5/5 RUE and RLE 5/5 Sensory: Symmetrical Deep Tendon Reflexes: 2+ and symmetric throughout Plantars: Right: Downgoing             left: downgoing Cerebellar: No ataxia with FNF bilaterally Gait: No hemiparetic gait  Discharge Diet       Diet   Diet 2 gram sodium Room service appropriate? Yes; Fluid consistency: Thin   liquids  DISCHARGE PLAN Disposition:  Home Eliquis (apixaban) daily for secondary stroke prevention  Ongoing stroke risk factor control by Primary Care Physician at time of discharge [facility] in 4 weeks, office to schedule an appointment.  Follow up with Heart Failure Clinic   45 minutes were spent preparing discharge.  Patient seen and examined by NP/APP with MD. MD to update note as needed.   DevoJanine OresP, FNP-BC [facility] Pager: [phone] NOTE: I reviewed above note and agree with the assessment and plan. Pt had cardiac MRI today showed EF 16% with ventricular noncompaction, likely source of embolus.  Cardiology started GDMT therapy, continue Eliquis and statin.  Patient is discharged in good condition with outpatient follow-up with PCP, cardiology and neurology.  For detailed assessment and plan, please refer to above/below as I have made changes wherever appropriate.   JindRosalin Hawking PhD Stroke Neurology 09/06/2021 5:15 PM

## 2021-09-06 NOTE — TOC Initial Note (Signed)
Transition of Care [facility]) - Initial/Assessment Note    Patient Details  Name: Gina Nichols MRN: [id number] Date of Birth: 06/12/1970  Transition of Care [facility]) CM/SW Contact:    Erenest Rasher, RN Phone Number:[phone] 09/06/2021, 4:04 PM  Clinical Narrative:                  HF TOC CM spoke to pt at bedside. States she has scale at home. Discussed daily weights and low sodium diet with HF. Reviewed medications and Wilder Glade and Jardiance clarified. Pt states she has [place] at home but explained her dose has changed. Explained her Unit RN will review in dc paperwork. Mother will provide transportation. She states he copay cost may cost financial hardship. Updated HF team. HF pharmacist states they will do patient assistance for Eliquis and Farxiga at her next appt. Pt is currently in Medicare Part D donut hole.  Pt reports she has Jardiance Rx waiting at her pharmacy. Explained she will not be taking Jardiance per attending.      Expected Discharge Plan: Home/Self Care Barriers to Discharge: No Barriers Identified   Patient Goals and CMS Choice Patient states their goals for this hospitalization and ongoing recovery are:: to get back home CMS Medicare.gov Compare Post Acute Care list provided to:: Patient Choice offered to / list presented to : Patient  Expected Discharge Plan and Services Expected Discharge Plan: Home/Self Care   Discharge Planning Services: CM Consult, Medication Assistance   Living arrangements for the past 2 months: Single Family Home Expected Discharge Date: 09/06/21                                    Prior Living Arrangements/Services Living arrangements for the past 2 months: Single Family Home Lives with:: Parents Patient language and need for interpreter reviewed:: Yes        Need for Family Participation in Patient Care: No (Comment) Care giver support system in place?: No (comment)   Criminal Activity/Legal Involvement Pertinent  to Current Situation/Hospitalization: No - Comment as needed  Activities of Daily Living Home Assistive Devices/Equipment: Blood pressure cuff, Scales ADL Screening (condition at time of admission) Patient's cognitive ability adequate to safely complete daily activities?: Yes Is the patient deaf or have difficulty hearing?: No Does the patient have difficulty seeing, even when wearing glasses/contacts?: No Does the patient have difficulty concentrating, remembering, or making decisions?: No Patient able to express need for assistance with ADLs?: Yes Does the patient have difficulty dressing or bathing?: No Independently performs ADLs?: Yes (appropriate for developmental age) Does the patient have difficulty walking or climbing stairs?: No Weakness of Legs: Right Weakness of Arms/Hands: Right  Permission Sought/Granted                  Emotional Assessment Appearance:: Appears stated age Attitude/Demeanor/Rapport: Gracious Affect (typically observed): Accepting Orientation: : Oriented to Self, Oriented to Place, Oriented to  Time, Oriented to Situation   Psych Involvement: No (comment)  Admission diagnosis:  Hypokalemia [E87.6] Elevated blood pressure reading [R03.0] Stroke (cerebrum) (HCC) [I63.9] Renal insufficiency [N28.9] Cerebrovascular accident (CVA), unspecified mechanism ([place]) [I63.9] Patient Active Problem List   Diagnosis Date Noted   Acute systolic heart failure ([place])    Stroke (cerebrum) ([place]) 08/30/2021   PCP:  Center, [place]:   [id number] [place], [place] [address] [place] [address] Phone: [phone] Fax: [phone]  [facility] [address] [place] [address] Phone: [phone] Fax: [phone]     Social Determinants of Health (SDOH) Interventions    Readmission Risk Interventions     No data to display

## 2021-09-06 NOTE — Progress Notes (Signed)
Pt had DC order after MRI and medication reconciliation was done. AVS was given and explained to pt, med was delivered to RN and handed to pt. All questions has been answered.

## 2021-09-06 NOTE — Progress Notes (Signed)
SLP Cancellation Note  Patient Details Name: Gina Nichols MRN: [id number] DOB: January 30, 1971   Cancelled treatment:       Reason Eval/Treat Not Completed: Patient at procedure or test/unavailable. Per RN, pt at MRI. Will f/u as able.     [name], [name], [place] Acute Rehabilitation Services Office Number: [phone]  [name] 09/06/2021, 1:19 PM

## 2021-09-06 NOTE — Progress Notes (Signed)
HF meds for discharge.  Farxiga 10 mg daily  Losartan 25 daily Digoxin 0.125 daily Spiro 12.5 daily Atorva 40 daily  Lasix 40 mg only as needed for weight gain/edema   Yiselle Babich NP-C  3:38 PM

## 2021-09-06 NOTE — Telephone Encounter (Signed)
Advanced Heart Failure Patient Advocate Encounter  Patient is currently admitted and will be following up with the AHF clinic upon discharge. Patient is currently in the donut hole causing her co-pays of Eliquis and Wilder Glade to be unaffordable. Would need to start assistance upon follow up.  Charlann Boxer, CPhT

## 2021-09-06 NOTE — Progress Notes (Addendum)
Advanced Heart Failure Rounding Note   Subjective:    Complaining of headache. Had some shortness of breath overnight. Improved with oxygen.    Objective:   Weight Range:  Vital Signs:   Temp:  [98.7 F (37.1 C)-99.4 F (37.4 C)] 98.9 F (37.2 C) (06/26 0300) Pulse Rate:  [84-100] 99 (06/26 0300) Resp:  [15-18] 16 (06/26 0300) BP: (93-115)/(66-93) 115/93 (06/26 0300) SpO2:  [100 %] 100 % (06/26 0300) Weight:  [82 kg] 82 kg (06/26 0500) Last BM Date : 09/04/21  Weight change: Filed Weights   09/04/21 0559 09/05/21 0500 09/06/21 0500  Weight: 81.2 kg 81.2 kg 82 kg    Intake/Output:  No intake or output data in the 24 hours ending 09/06/21 0817   General:   No resp difficulty HEENT: normal Neck: supple. no JVD. Carotids 2+ bilat; no bruits. No lymphadenopathy or thryomegaly appreciated. Cor: PMI nondisplaced. Regular rate & rhythm. No rubs, gallops or murmurs. Lungs: clear on 1 liter [place]  Abdomen: soft, nontender, nondistended. No hepatosplenomegaly. No bruits or masses. Good bowel sounds. Extremities: no cyanosis, clubbing, rash, edema Neuro: alert & orientedx3, cranial nerves grossly intact. moves all 4 extremities w/o difficulty. Affect pleasant  Telemetry: SR 80-90s with occasional PVCs. < 5 per hour    Labs: Basic Metabolic Panel: Recent Labs  Lab 09/02/21 0139 09/02/21 1426 09/02/21 1433 09/03/21 0023 09/04/21 0155 09/05/21 0328 09/06/21 0021  NA 141   < > 142 141 142 141 135  K 3.8   < > 3.4* 3.9 3.7 4.5 3.8  CL 110  --   --  109 110 111 106  CO2 24  --   --  '24 23 26 22  '$ GLUCOSE 110*  --   --  95 84 86 87  BUN 11  --   --  '11 13 14 13  '$ CREATININE 1.00  --   --  0.91 0.99 1.08* 1.07*  CALCIUM 8.5*  --   --  8.5* 8.6* 8.8* 8.1*   < > = values in this interval not displayed.    Liver Function Tests: No results for input(s): "AST", "ALT", "ALKPHOS", "BILITOT", "PROT", "ALBUMIN" in the last 168 hours. No results for input(s): "LIPASE",  "AMYLASE" in the last 168 hours. No results for input(s): "AMMONIA" in the last 168 hours.  CBC: Recent Labs  Lab 09/02/21 1433 09/03/21 0023 09/04/21 0155 09/05/21 0328 09/06/21 0021  WBC  --  5.6 5.6 5.0 5.2  HGB 11.6* 11.6* 11.6* 11.4* 10.9*  HCT 34.0* 35.4* 35.0* 35.2* 33.5*  MCV  --  98.6 98.9 99.7 101.2*  PLT  --  183 179 204 187    Cardiac Enzymes: No results for input(s): "CKTOTAL", "CKMB", "CKMBINDEX", "TROPONINI" in the last 168 hours.  BNP: BNP (last 3 results) Recent Labs    08/30/21 0045 09/01/21 0141  BNP 999.9* 1,548.4*    ProBNP (last 3 results) No results for input(s): "PROBNP" in the last 8760 hours.    Other results:  Imaging: No results found.   Medications:     Scheduled Medications:  apixaban  5 mg Oral BID   atorvastatin  40 mg Oral Daily   digoxin  0.125 mg Oral Daily   furosemide  40 mg Oral Daily   insulin aspart  0-15 Units Subcutaneous TID WC   losartan  12.5 mg Oral Daily   pantoprazole  40 mg Oral Q1200   sodium chloride flush  3 mL Intravenous Q12H   spironolactone  12.5 mg Oral Daily    Infusions:  sodium chloride     lactated ringers      PRN Medications: sodium chloride, acetaminophen **OR** acetaminophen (TYLENOL) oral liquid 160 mg/5 mL **OR** acetaminophen, acetaminophen, albuterol, nitroGLYCERIN, ondansetron (ZOFRAN) IV, phenol, polyvinyl alcohol, senna-docusate, sodium chloride flush   Assessment/Plan :   1. Acute CVA, suspect cardio-embolic - LMCA Infarct s/p thrombectomy - Brain MR- patchy left hemispheric infarcts involving frontal lobes ACA/MCA watershed - TEE-  EF 20% No PFO/thrombus but with smoke in LA - suspect cardio-embolic -> would suggest AC when ok from neuro standpoint   2. Acute HFrEF, NICM  - Echo EF 20-25% RV stable.  - Cath 6/23 with normal cors, elevated filling pressures, and preserved cardiac output.  - TEE - EF 20% No PFO/thrombus but with smoke in LA.  - CMR today.   - Volume  status ok. Stop lasix. Start jardiance 10 mg daily  - Continue dig 0.125 mg daily - Continue spironolactone 12.5 mg daily  - Continue losartan 12.5  daily.  - Renal function stable.    3. Sinus Tach - improved - Stable.    4. H/O SVT with ablation -Had ablation 2015 at [facility]    5. Tobacco Abuse - aware of need for cessation  6. Chest pain - doubt cardiac. Normal cors on cath hstrop 18 - Continue protonix.    Will ask pharmacy to check meds cost. Length of Stay: [place] NP-C  09/06/2021, 8:17 AM  Advanced Heart Failure Team Pager [phone] (M-F; 7a - 4p)  Please contact [facility] for night-coverage after hours (4p -7a ) and weekends on [email or web address]  Patient seen and examined with the above-signed Advanced Practice Provider and/or Housestaff. I personally reviewed laboratory data, imaging studies and relevant notes. I independently examined the patient and formulated the important aspects of the plan. I have edited the note to reflect any of my changes or salient points. I have personally discussed the plan with the patient and/or family.   Feels ok this morning. No CP or SOB.   General:  Well appearing. No resp difficulty HEENT: normal Neck: supple. no JVD. Carotids 2+ bilat; no bruits. No lymphadenopathy or thryomegaly appreciated. Cor: PMI nondisplaced. Regular rate & rhythm. No rubs, gallops or murmurs. Lungs: clear Abdomen: soft, nontender, nondistended. No hepatosplenomegaly. No bruits or masses. Good bowel sounds. Extremities: no cyanosis, clubbing, rash, edema Neuro: alert & orientedx3, cranial nerves grossly intact. moves all 4 extremities w/o difficulty. Affect pleasant  Improving. For cMRI today and likely home this afternoon from our standpoint  Home cardiac meds  Jardiance 10 daily Losartan 25 daily Digoxin 0.125 daily Spiro 12.5 daily Atorva 40 daily  Lasix 40 mg only as needed for weight gain/edema  Will arrange f/u in HF  clinic  Glori Bickers, MD  11:00 AM

## 2021-09-06 NOTE — Progress Notes (Signed)
PT Cancellation Note  Patient Details Name: Gina Nichols MRN: [id number] DOB: December 24, 1970   Cancelled Treatment:    Reason Eval/Treat Not Completed: Patient at procedure or test/unavailable. Pt currently off unit at MRI. Discussed pt case with RN who reports plan is for d/c after pt returns from MRI. Pt does not need to see PT prior to d/c, however will follow up at the next available date if d/c is delayed.    Thelma Comp 09/06/2021, 1:16 PM  Rolinda Roan, PT, DPT Acute Rehabilitation Services Secure Chat Preferred Office: [phone]

## 2021-09-06 NOTE — TOC Benefit Eligibility Note (Signed)
Patient Teacher, English as a foreign language completed.    The patient is currently admitted and upon discharge could be taking Eliquis 5 mg.  The current 30 day co-pay is, $151.46 due to being in Coverage Gap (donut hole).   The patient is insured through [place], [place] Patient Advocate Specialist [place] Patient Advocate Team Direct Number: [phone]  Fax: [phone]

## 2021-09-15 ENCOUNTER — Other Ambulatory Visit (HOSPITAL_COMMUNITY): Payer: Self-pay

## 2021-09-21 ENCOUNTER — Other Ambulatory Visit (HOSPITAL_COMMUNITY): Payer: Self-pay

## 2021-09-21 ENCOUNTER — Ambulatory Visit (HOSPITAL_COMMUNITY)
Admit: 2021-09-21 | Discharge: 2021-09-21 | Disposition: A | Discharge status: Home / Self Care | Payer: Medicare HMO | Attending: Family Medicine | Admitting: Family Medicine

## 2021-09-21 ENCOUNTER — Encounter (HOSPITAL_COMMUNITY): Payer: Self-pay

## 2021-09-21 VITALS — BP 114/78 | HR 82 | Wt 164.4 lb

## 2021-09-21 DIAGNOSIS — Z9884 Bariatric surgery status: Secondary | ICD-10-CM | POA: Diagnosis not present

## 2021-09-21 DIAGNOSIS — Z8673 Personal history of transient ischemic attack (TIA), and cerebral infarction without residual deficits: Secondary | ICD-10-CM | POA: Insufficient documentation

## 2021-09-21 DIAGNOSIS — Z8679 Personal history of other diseases of the circulatory system: Secondary | ICD-10-CM

## 2021-09-21 DIAGNOSIS — I3139 Other pericardial effusion (noninflammatory): Secondary | ICD-10-CM | POA: Insufficient documentation

## 2021-09-21 DIAGNOSIS — R000 Tachycardia, unspecified: Secondary | ICD-10-CM | POA: Insufficient documentation

## 2021-09-21 DIAGNOSIS — Z79899 Other long term (current) drug therapy: Secondary | ICD-10-CM | POA: Insufficient documentation

## 2021-09-21 DIAGNOSIS — R531 Weakness: Secondary | ICD-10-CM | POA: Insufficient documentation

## 2021-09-21 DIAGNOSIS — I63412 Cerebral infarction due to embolism of left middle cerebral artery: Secondary | ICD-10-CM | POA: Diagnosis not present

## 2021-09-21 DIAGNOSIS — Z7984 Long term (current) use of oral hypoglycemic drugs: Secondary | ICD-10-CM | POA: Diagnosis not present

## 2021-09-21 DIAGNOSIS — I5022 Chronic systolic (congestive) heart failure: Secondary | ICD-10-CM | POA: Diagnosis present

## 2021-09-21 DIAGNOSIS — I110 Hypertensive heart disease with heart failure: Secondary | ICD-10-CM | POA: Insufficient documentation

## 2021-09-21 DIAGNOSIS — R5383 Other fatigue: Secondary | ICD-10-CM | POA: Insufficient documentation

## 2021-09-21 DIAGNOSIS — Z7901 Long term (current) use of anticoagulants: Secondary | ICD-10-CM | POA: Insufficient documentation

## 2021-09-21 DIAGNOSIS — R339 Retention of urine, unspecified: Secondary | ICD-10-CM | POA: Insufficient documentation

## 2021-09-21 DIAGNOSIS — Z720 Tobacco use: Secondary | ICD-10-CM

## 2021-09-21 DIAGNOSIS — F8081 Childhood onset fluency disorder: Secondary | ICD-10-CM | POA: Diagnosis not present

## 2021-09-21 DIAGNOSIS — I428 Other cardiomyopathies: Secondary | ICD-10-CM | POA: Insufficient documentation

## 2021-09-21 LAB — CBC
HCT: 35.1 % — ABNORMAL LOW (ref 36.0–46.0)
Hemoglobin: 11.3 g/dL — ABNORMAL LOW (ref 12.0–15.0)
MCH: 32 pg (ref 26.0–34.0)
MCHC: 32.2 g/dL (ref 30.0–36.0)
MCV: 99.4 fL (ref 80.0–100.0)
Platelets: 230 10*3/uL (ref 150–400)
RBC: 3.53 MIL/uL — ABNORMAL LOW (ref 3.87–5.11)
RDW: 14.3 % (ref 11.5–15.5)
WBC: 4.4 10*3/uL (ref 4.0–10.5)
nRBC: 0 % (ref 0.0–0.2)

## 2021-09-21 LAB — BASIC METABOLIC PANEL
Anion gap: 5 (ref 5–15)
BUN: 12 mg/dL (ref 6–20)
CO2: 23 mmol/L (ref 22–32)
Calcium: 8.8 mg/dL — ABNORMAL LOW (ref 8.9–10.3)
Chloride: 111 mmol/L (ref 98–111)
Creatinine, Ser: 1.08 mg/dL — ABNORMAL HIGH (ref 0.44–1.00)
GFR, Estimated: >60 mL/min (ref >60)
Glucose, Bld: 90 mg/dL (ref 70–99)
Potassium: 3.5 mmol/L (ref 3.5–5.1)
Sodium: 139 mmol/L (ref 135–145)

## 2021-09-21 MED ORDER — LOSARTAN POTASSIUM 25 MG PO TABS: 25.0000 mg | ORAL_TABLET | Freq: Every day | ORAL | 3 refills | Status: DC

## 2021-09-22 ENCOUNTER — Other Ambulatory Visit (HOSPITAL_COMMUNITY): Payer: Self-pay

## 2021-09-23 ENCOUNTER — Telehealth (HOSPITAL_COMMUNITY): Payer: Self-pay

## 2021-09-23 ENCOUNTER — Other Ambulatory Visit (HOSPITAL_COMMUNITY): Payer: Self-pay

## 2021-09-23 NOTE — Telephone Encounter (Signed)
Pharmacy Transitions of Care Follow-up Telephone Call  Date of discharge: 09/06/2021  Discharge Diagnosis: Stroke  How have you been since you were released from the hospital? Patient has been doing well since discharge. She has no questions or concerns regarding her medications. [facility]    Medication changes made at discharge: START taking: atorvastatin (LIPITOR)  digoxin (LANOXIN)  Eliquis (apixaban)  nitroGLYCERIN (NITROSTAT)  spironolactone (ALDACTONE)  CHANGE how you take: Farxiga (dapagliflozin propanediol)    STOP taking: bisoprolol-hydrochlorothiazide 2.5-6.25 MG tablet (ZIAC)  Entresto 24-26 MG (sacubitril-valsartan)  Ferric Maltol 30 MG Caps  furosemide 20 MG tablet (LASIX)   Medication changes verified by the patient? Yes    Medication Accessibility:  [facility]   Was the patient provided with refills on discharged medications? Yes   Have all prescriptions been transferred from [facility] to home pharmacy? Yes   Is the patient able to afford medications? Has insurance, [employer].     Medication Review:   APIXABAN (ELIQUIS)  Apixaban 5 mg BID started on 09/06/2021 - Discussed importance of taking medication around the same time everyday  - Advised patient of medications to avoid (NSAIDs, ASA)  - Educated that Tylenol (acetaminophen) will be the preferred analgesic to prevent risk of bleeding  - Emphasized importance of monitoring for signs and symptoms of bleeding (abnormal bruising, prolonged bleeding, nose bleeds, bleeding from gums, discolored urine, black tarry stools)  - Advised patient to alert all providers of anticoagulation therapy prior to starting a new medication or having a procedure   Follow-up Appointments:  [facility] f/u appt confirmed? Seen Allena Katz, FNP on 09/21/2021.    If their condition worsens, is the pt aware to call PCP or go to the Emergency Dept.? Yes  Final  Patient Assessment: Patient has refills at home pharmacy and has already had follow-up.

## 2021-10-05 ENCOUNTER — Ambulatory Visit (HOSPITAL_COMMUNITY)
Admission: RE | Admit: 2021-10-05 | Discharge: 2021-10-05 | Disposition: A | Discharge status: Home / Self Care | Payer: Medicare HMO | Source: Ambulatory Visit | Attending: Internal Medicine | Admitting: Internal Medicine

## 2021-10-05 DIAGNOSIS — I5022 Chronic systolic (congestive) heart failure: Secondary | ICD-10-CM | POA: Diagnosis present

## 2021-10-05 LAB — BASIC METABOLIC PANEL
Anion gap: 6 (ref 5–15)
BUN: 8 mg/dL (ref 6–20)
CO2: 23 mmol/L (ref 22–32)
Calcium: 8.9 mg/dL (ref 8.9–10.3)
Chloride: 113 mmol/L — ABNORMAL HIGH (ref 98–111)
Creatinine, Ser: 1.04 mg/dL — ABNORMAL HIGH (ref 0.44–1.00)
GFR, Estimated: >60 mL/min (ref >60)
Glucose, Bld: 81 mg/dL (ref 70–99)
Potassium: 4.3 mmol/L (ref 3.5–5.1)
Sodium: 142 mmol/L (ref 135–145)

## 2021-10-05 LAB — DIGOXIN LEVEL: Digoxin Level: 0.8 ng/mL (ref 0.8–2.0)

## 2021-10-06 ENCOUNTER — Other Ambulatory Visit (HOSPITAL_COMMUNITY): Payer: Self-pay

## 2021-10-12 ENCOUNTER — Other Ambulatory Visit: Payer: Self-pay | Admitting: Obstetrics and Gynecology

## 2021-10-12 DIAGNOSIS — R928 Other abnormal and inconclusive findings on diagnostic imaging of breast: Secondary | ICD-10-CM

## 2021-10-13 ENCOUNTER — Other Ambulatory Visit (HOSPITAL_COMMUNITY): Payer: Self-pay

## 2021-10-19 ENCOUNTER — Telehealth (HOSPITAL_COMMUNITY): Payer: Self-pay | Admitting: Pharmacist

## 2021-10-19 ENCOUNTER — Ambulatory Visit (HOSPITAL_COMMUNITY)
Admission: RE | Admit: 2021-10-19 | Discharge: 2021-10-19 | Disposition: A | Discharge status: Home / Self Care | Payer: Medicare HMO | Source: Ambulatory Visit | Attending: Cardiology | Admitting: Cardiology

## 2021-10-19 ENCOUNTER — Telehealth (HOSPITAL_COMMUNITY): Payer: Self-pay | Admitting: Licensed Clinical Social Worker

## 2021-10-19 VITALS — BP 132/76 | HR 76 | Wt 162.0 lb

## 2021-10-19 DIAGNOSIS — I639 Cerebral infarction, unspecified: Secondary | ICD-10-CM | POA: Diagnosis not present

## 2021-10-19 DIAGNOSIS — I5022 Chronic systolic (congestive) heart failure: Secondary | ICD-10-CM

## 2021-10-19 MED ORDER — ATORVASTATIN CALCIUM 40 MG PO TABS: 40.0000 mg | ORAL_TABLET | Freq: Every day | ORAL | 1 refills | Status: DC

## 2021-10-19 MED ORDER — DIGOXIN 125 MCG PO TABS: 0.1250 mg | ORAL_TABLET | Freq: Every day | ORAL | 1 refills | Status: DC

## 2021-10-19 MED ORDER — CARVEDILOL 3.125 MG PO TABS: 3.1250 mg | ORAL_TABLET | Freq: Two times a day (BID) | ORAL | 3 refills | Status: DC

## 2021-10-19 MED ORDER — SPIRONOLACTONE 25 MG PO TABS: 25.0000 mg | ORAL_TABLET | Freq: Every day | ORAL | 1 refills | Status: DC

## 2021-10-19 NOTE — Telephone Encounter (Signed)
CSW referred to assist patient with Medicare LIS. CSW contacted patient who states that she has contacted Medicare and found to not be eligible for the program as she is over income. Patient states her medications are affordable other than the two meds Wilder Glade and Eliquis which is is working with Pharmacy to apply for PAP. Patient grateful for the call and support. Raquel Sarna, [place], [place]

## 2021-10-19 NOTE — Patient Instructions (Addendum)
It was a pleasure seeing you today!  MEDICATIONS: -We are changing your medications today. -Start carvedilol 3.125 mg twice daily. -Call if you have questions about your medications.  LABS: -We will call you if your labs need attention.  NEXT APPOINTMENT: Return to clinic on 9/6 for APP visit.  In general, to take care of your heart failure: -Limit your fluid intake to 2 Liters (half-gallon) per day.   -Limit your salt intake to ideally 2-3 grams (2000-3000 mg) per day. -Weigh yourself daily and record, and bring that "weight diary" to your next appointment.  (Weight gain of 2-3 pounds in 1 day typically means fluid weight.) -The medications for your heart are to help your heart and help you live longer.   -Please contact us before stopping any of your heart medications.  Call the clinic at [phone] with questions or to reschedule future appointments.

## 2021-10-19 NOTE — Telephone Encounter (Signed)
Medication Samples have been provided to the patient.   Drug name: Eliquis     Strength: 5 mg      Qty: 3 boxes                 LOT: [id number] Exp.Date: 06/2023   Dosing instructions: 1 tablet BID  Drug name: Wilder Glade     Strength: 10 mg      Qty: 2 boxes                 LOT: [id number] Exp.Date: 04/13/24   Dosing instructions: 1 tablet daily   The patient has been instructed regarding the correct time, dose, and frequency of taking this medication, including desired effects and most common side effects.   Audry Riles, PharmD, BCPS, CPP Heart Failure Clinic Pharmacist [phone]

## 2021-10-19 NOTE — Progress Notes (Signed)
Advanced Heart Failure Clinic Note   [facility] Care: [facility] Cardiologist: Dr. Haroldine Laws  HPI:  Gina Nichols is a 51 y.o. 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     She had not been seen by Dr Nicky Pugh in several years.    Admitted 08/2021 with R hemiplegia/right facial droop, and hemiplegia. Admitted with code stroke. CTA head/neck with left MCA territory infarct. Underwent mechanical thrombectomy. Echo with newly reduced EF 20-25%. Cardiology consulted. TEE showed EF < 20% RV normal, no thrombus, and no PFO.  Underwent R/LHC showing normal coronaries. PA Sat 62% , RA 12, PA 53/32 (41), PCWP 33, CO 4.6, and CI 2.4. Diuresed with IV Lasix and GDMT titrated. cMRI showed LVEF 16%, RVEF 39%, no obvious thrombus, moderate functional MR, moderate pericardial effusion (no tamponade), no ASD/PFO, no LGE. Discharged home, weight 179 lbs.  Recently presented to [facility]21[facility].  Overall was feeling fair. She was not very active at home, but no SOB with walking on flat ground or ADLs. Felt fatigued. Main complaint was bladder fullness and difficulty with emptying. No low back pain or dysuria. Denied palpitations, abnormal bleeding, CP, dizziness, edema, or PND/Orthopnea. Appetite was ok. No fever or chills. Weight at home was 161 pounds. Reported taking all medications. Had not needed Lasix. Right side remained mildly weak, struggles with stuttering.  Today she returns to HF clinic for pharmacist medication titration. At last visit with APP, losartan was increased from 12.5 mg to 25 mg and follow-up labs were stable. Overall feeling ok. Felt that she had some fluid buildup 2 days ago, noted "gurgling in her throat", she did not take a Lasix dose. This has since resolved. No SOB with mild  activities. Weight is 161-164 lbs at home. Has not needed any PRN Lasix. No LEE, PND or orthopnea. Of note, patient reports she has been taking losartan 25 mg daily AND spironolactone 25 mg daily (taking 1 spironolactone tablet, not 1/2 tablet).   HF Medications: Losartan 25 mg daily Spironolactone 12.5 mg daily - patient taking 25 mg daily Farxiga 10 mg daily Digoxin 0.125 mg daily Lasix 40 mg PRN   Has the patient been experiencing any side effects to the medications prescribed?  No  Does the patient have any problems obtaining medications due to transportation or finances?  [employer] Medicare. Yes, patient currently in donut hole. Farxiga $158, Eliquis $150. PAP started for both today. Samples also provided. Patient may qualify for Medicare LIS. Sent referral to HF [facility] to assess.   Understanding of regimen: good Understanding of indications: good Potential of compliance: good Patient understands to avoid NSAIDs. Patient understands to avoid decongestants.   Pertinent Lab Values: (10/05/2021) Serum creatinine 1.04, BUN 8, Potassium 4.3, Sodium 142, Digoxin 0.8   Vital Signs: Weight: 162 lbs (last clinic weight: 164 lbs) Blood pressure: 132/76  Heart rate: 76 bpm   Assessment/Plan: Chronic Systolic Heart Failure - NICM - Echo (08/2021):EF 20-25%, RV stable.  - R/LHC (08/2021): with normal cors, elevated filling pressures, and preserved cardiac output.  - TEE (08/2021): EF 20%, no PFO/thrombus but with smoke in LA.  - cMRI (08/2021) showed LVEF 16%, RVEF 39%, moderate pericardial effusion, moderate functional MR, no thrombus  - NYHA II. Euvolemic on exam.  - Continue Lasix 40 mg PRN. -  Start carvedilol 3.125 mg BID.  - Continue losartan 25 mg daily. - Continue spironolactone 25 mg daily. Updated medication list to reflect how patient has been taking it.  - Continue Farxiga 10 mg daily.  - Continue digoxin 0.125 mg daily. - Repeat echo after GDMT optimized.   2. 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   3. Sinus Tach - Improved. HR 76 today. - Stable.    4. H/o SVT with ablation - Had ablation 2015 at [facility]    5. Tobacco Abuse - Aware of need for cessation.   6. Urinary retention - On SGLT2i.  Follow up at APP clinic in 4 weeks.   Audry Riles, PharmD, BCPS, BCCP, CPP Heart Failure Clinic Pharmacist [phone]

## 2021-10-19 NOTE — Telephone Encounter (Signed)
Sent in [employer] Assistance application to [employer] for [place].   Sent in Manufacturer's Assistance application to [place] for Eliquis. Patient is aware OOP expense report will need to be provided before application will be approved.    Application pending, will continue to follow.   Audry Riles, PharmD, BCPS, BCCP, CPP Heart Failure Clinic Pharmacist [phone]

## 2021-10-20 NOTE — Telephone Encounter (Signed)
Advanced Heart Failure Patient Advocate Encounter   Patient was approved to receive Farxiga from [employer]  Effective dates: 10/20/21 through 03/13/22  Charlann Boxer, CPhT

## 2021-10-27 ENCOUNTER — Ambulatory Visit
Admission: RE | Admit: 2021-10-27 | Discharge: 2021-10-27 | Disposition: A | Discharge status: Home / Self Care | Payer: Medicare HMO | Source: Ambulatory Visit | Attending: Obstetrics and Gynecology | Admitting: Obstetrics and Gynecology

## 2021-10-27 DIAGNOSIS — R928 Other abnormal and inconclusive findings on diagnostic imaging of breast: Secondary | ICD-10-CM

## 2021-10-30 ENCOUNTER — Other Ambulatory Visit: Payer: Self-pay | Admitting: Cardiology

## 2021-10-30 DIAGNOSIS — I5022 Chronic systolic (congestive) heart failure: Secondary | ICD-10-CM

## 2021-10-30 MED ORDER — SPIRONOLACTONE 25 MG PO TABS: 25.0000 mg | ORAL_TABLET | Freq: Every day | ORAL | 2 refills | Status: DC

## 2021-11-17 ENCOUNTER — Encounter (HOSPITAL_COMMUNITY): Payer: Medicare HMO

## 2021-11-22 ENCOUNTER — Telehealth (HOSPITAL_COMMUNITY): Payer: Self-pay

## 2021-11-22 NOTE — Telephone Encounter (Signed)
Called and left a message to confirm/remind patient of their appointment at the [facility] on 9/.   Patient reminded to bring all medications and/or complete list.

## 2021-11-22 NOTE — Progress Notes (Unsigned)
[facility] [address] [place]. [place] [address] [phone]       HOSPITAL FOLLOW UP NOTE  [name] Date of Birth:  [birth year] Medical Record Number:  [id number]   Reason for Referral:  hospital stroke follow up    SUBJECTIVE:   CHIEF COMPLAINT:  No chief complaint on file.   HPI:   Ms. Gina Nichols is a 51 y.o. 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  Stroke work-up revealed left MCA multifocal small/punctate infarcts due to left M1/M2 occlusion s/p IR with TICI 3 reperfusion and trace sylvian fissure SAH, likely from newly diagnosed cardiomyopathy with low EF.  2D echo EF of 20 to 25%, TEE EF 20% without evidence of clot or PFO, cardiac MRI EF 16%.  Initiated Eliquis 5 mg twice daily for stroke prevention and recommended outpatient follow-up in heart failure clinic.  Recommended 30-day cardiac event monitor to assess for arrhythmia.  LDL 123, initiate atorvastatin 40 mg daily.  A1c 5.3.        PERTINENT IMAGING  Per hospitalization 08/30/2021 -09/06/2021 CT head: Postsurgical changes in the left frontal lobe with encephalomalacia and a small somewhat hyperdense mass along the lateral aspect of the left ventricle/resection cavity. ASPECTS 10 CTA of head and neck: Occlusion of the proximal left M2, with possible distal reconstitution but relatively poor perfusion in the left MCA territory.  IR left M1/M2 occlusion with distal embolus, achieving TICI3  Post IR CT Trace SAH vs contrast extravasation in the left Sylvian fissure  MRI patchy left hemispheric infarcts involving frontal lobes ACA/MCA watershed Carotid Ultrasound  unremarkable 2D Echo EF 20 to 25%  TEE no clot or PFO. EF 20%  LDL 123 HgbA1c 5.3    ROS:   14 system review of systems performed and negative with exception of ***  PMH:  Past Medical History:  Diagnosis Date   Anginal pain ([place])    Chest pressure when bending over most recent Oct. 31, 2015   Depression    history of   Diabetes mellitus without complication ([place])    pre-diabetic   Dysrhythmia    SVT s/p ablation 2015   Headache    s/p brain tumor 11/1999   Left leg numbness    s/p back surgery   Shortness of breath dyspnea    with stairs, wakes up out of sleep, occassional wheezes noted by patient    PSH:  Past Surgical History:  Procedure Laterality Date   ABLATION     APPENDECTOMY     BACK SURGERY     brain tumor     BREAST REDUCTION SURGERY     BUBBLE STUDY  09/02/2021   Procedure: BUBBLE STUDY;  Surgeon: Pixie Casino, MD;  Location: [facility];  Service: Cardiovascular;;   COLONOSCOPY     IR CT HEAD LTD  08/30/2021   IR PERCUTANEOUS ART THROMBECTOMY/INFUSION INTRACRANIAL INC DIAG ANGIO  08/30/2021   IR US GUIDE VASC ACCESS RIGHT  08/30/2021   LAPAROSCOPY N/A 03/03/2014   Procedure: LAPAROSCOPY OPERATIVE;  Surgeon: Cheri Fowler, MD;  Location: [facility];  Service: Gynecology;  Laterality: N/A;  Physician only need 1hr OR time   RADIOLOGY WITH ANESTHESIA Right 08/30/2021   Procedure:  IR WITH ANESTHESIA;  Surgeon: Radiologist, Medication, MD;  Location: [facility];  Service: Radiology;  Laterality: Right;   RIGHT/LEFT HEART CATH AND CORONARY ANGIOGRAPHY N/A 09/02/2021   Procedure: RIGHT/LEFT HEART CATH AND CORONARY ANGIOGRAPHY;  Surgeon: Jettie Booze, MD;  Location: [facility];  Service: Cardiovascular;  Laterality: N/A;   SALPINGOOPHORECTOMY Right 03/03/2014   Procedure: SALPINGO OOPHORECTOMY;  Surgeon: Cheri Fowler, MD;  Location: [facility];  Service: Gynecology;  Laterality: Right;   TEE WITHOUT CARDIOVERSION N/A 09/02/2021   Procedure:  TRANSESOPHAGEAL ECHOCARDIOGRAM (TEE);  Surgeon: Pixie Casino, MD;  Location: [facility];  Service: Cardiovascular;  Laterality: N/A;   WISDOM TOOTH EXTRACTION      Social History:  Social History   Socioeconomic History   Marital status: Single    Spouse name: Not on file   Number of children: Not on file   Years of education: Not on file   Highest education level: Not on file  Occupational History   Not on file  Tobacco Use   Smoking status: Some Days    Types: Cigars   Smokeless tobacco: Never  Vaping Use   Vaping Use: Never used  Substance and Sexual Activity   Alcohol use: Yes    Comment: drinks socially   Drug use: Never   Sexual activity: Not Currently    Birth control/protection: None  Other Topics Concern   Not on file  Social History Narrative   ** Merged History Encounter **       Social Determinants of Health   Financial Resource Strain: Not on file  Food Insecurity: Not on file  Transportation Needs: Not on file  Physical Activity: Not on file  Stress: Not on file  Social Connections: Not on file  Intimate Partner Violence: Not on file    Family History: No family history on file.  Medications:   Current Outpatient Medications on File Prior to Visit  Medication Sig Dispense Refill   apixaban (ELIQUIS) 5 MG TABS tablet Take 1 tablet (5 mg total) by mouth 2 (two) times daily. 60 tablet 1   atorvastatin (LIPITOR) 40 MG tablet Take 1 tablet (40 mg total) by mouth daily. 30 tablet 1   benzonatate (TESSALON) 100 MG capsule Take 1 capsule (100 mg total) by mouth 3 (three) times daily as needed for cough. 21 capsule 0   carvedilol (COREG) 3.125 MG tablet Take 1 tablet (3.125 mg total) by mouth 2 (two) times daily with a meal. 90 tablet 3   cholecalciferol (VITAMIN D) 1000 units tablet Take 1,000 Units by mouth daily.     dapagliflozin propanediol (FARXIGA) 10 MG TABS tablet Take 1 tablet (10 mg total) by mouth daily. 30 tablet 1   digoxin (LANOXIN)  0.125 MG tablet Take 1 tablet (0.125 mg total) by mouth daily. 30 tablet 1   ergocalciferol (VITAMIN D2) 1.25 MG (50000 UT) capsule Take 50,000 Units by mouth every Monday.     levocetirizine (XYZAL) 5 MG tablet Take 5 mg by mouth daily. (Patient not taking: Reported on 09/21/2021)     losartan (COZAAR) 25 MG tablet Take 1 tablet (25 mg total) by mouth daily. 30 tablet 3   Multiple Vitamins-Minerals (EMERGEN-C IMMUNE PO) Take 1 packet by mouth daily.     naproxen (NAPROSYN) 500 MG tablet Take 1 tablet (500 mg total) by mouth 2 (two) times daily. 30 tablet 0   nitroGLYCERIN (NITROSTAT) 0.4 MG SL tablet Place 1 tablet (0.4 mg total) under the tongue every  5 (five) minutes as needed for chest pain. 25 tablet 12   NP THYROID 15 MG tablet TK 1 T PO QD OES  1   pantoprazole (PROTONIX) 40 MG tablet Take 40 mg by mouth 2 (two) times daily.     spironolactone (ALDACTONE) 25 MG tablet Take 1 tablet (25 mg total) by mouth daily. 30 tablet 2   No current facility-administered medications on file prior to visit.    Allergies:  No Known Allergies    OBJECTIVE:  Physical Exam  There were no vitals filed for this visit. There is no height or weight on file to calculate BMI. No results found.      No data to display           General: well developed, well nourished, seated, in no evident distress Head: head normocephalic and atraumatic.   Neck: supple with no carotid or supraclavicular bruits Cardiovascular: regular rate and rhythm, no murmurs Musculoskeletal: no deformity Skin:  no rash/petichiae Vascular:  Normal pulses all extremities   Neurologic Exam Mental Status: Awake and fully alert. Oriented to place and time. Recent and remote memory intact. Attention span, concentration and fund of knowledge appropriate. Mood and affect appropriate.  Cranial Nerves: Fundoscopic exam reveals sharp disc margins. Pupils equal, briskly reactive to light. Extraocular movements full without nystagmus.  Visual fields full to confrontation. Hearing intact. Facial sensation intact. Face, tongue, palate moves normally and symmetrically.  Motor: Normal bulk and tone. Normal strength in all tested extremity muscles Sensory.: intact to touch , pinprick , position and vibratory sensation.  Coordination: Rapid alternating movements normal in all extremities. Finger-to-nose and heel-to-shin performed accurately bilaterally. Gait and Station: Arises from chair without difficulty. Stance is normal. Gait demonstrates normal stride length and balance with ***. Tandem walk and heel toe ***.  Reflexes: 1+ and symmetric. Toes downgoing.     NIHSS  *** Modified Rankin  ***      ASSESSMENT: Gina Nichols is a 51 y.o. year old female with left MCA multifocal small/punctate infarcts on 08/30/2021 due to left M1/M2 occlusion s/p IR with TICI 3 reperfusion and trace sylvian fissure SAH, likely from new diagnosis of cardiomyopathy with low EF. 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  Continue Eliquis '5mg'$  twice daily and atorvastatin (Lipitor) for secondary stroke prevention.   Discussed secondary stroke prevention measures and importance of close PCP follow up for aggressive stroke risk factor management including BP goal<130/90, HLD with LDL goal<70 and DM with A1c.<7 .  Stroke labs 08/2021: LDL 123, A1c 5.3 I have gone over the pathophysiology of stroke, warning signs and symptoms, risk factors and their management in some detail with instructions to go to the closest emergency room for symptoms of concern. Cardiomyopathy: Continue Eliquis 5 mg twice daily, continue routine follow-up with CHF clinic,  plans on follow-up echo next month Tobacco use:    Follow up in *** or call earlier if needed   CC:  [facility] provider: Dr. Leonie Man PCP: [facility] spent *** minutes of face-to-face and non-face-to-face time with patient.  This included previsit chart review including review of recent hospitalization, lab review, study review, order entry,  electronic health record documentation, patient education regarding recent stroke including etiology, secondary stroke prevention measures and importance of managing stroke risk factors, residual deficits and typical recovery time and answered all other questions to patient satisfaction   Frann Rider, [facility]  [facility] [address] [place], [place] [address]  Phone [phone] Fax [phone] Note: This document was prepared with digital dictation and possible smart phrase technology. Any transcriptional errors that result from this process are unintentional.

## 2021-11-23 ENCOUNTER — Ambulatory Visit (INDEPENDENT_AMBULATORY_CARE_PROVIDER_SITE_OTHER): Payer: Medicare Other | Admitting: Adult Health

## 2021-11-23 ENCOUNTER — Encounter: Payer: Self-pay | Admitting: Adult Health

## 2021-11-23 ENCOUNTER — Ambulatory Visit (HOSPITAL_COMMUNITY)
Admission: RE | Admit: 2021-11-23 | Discharge: 2021-11-23 | Disposition: A | Discharge status: Home / Self Care | Payer: Medicare Other | Source: Ambulatory Visit | Attending: Family Medicine | Admitting: Family Medicine

## 2021-11-23 ENCOUNTER — Encounter (HOSPITAL_COMMUNITY): Payer: Self-pay

## 2021-11-23 VITALS — BP 121/80 | HR 87 | Ht 65.0 in | Wt 164.0 lb

## 2021-11-23 VITALS — BP 112/74 | HR 83 | Wt 162.8 lb

## 2021-11-23 DIAGNOSIS — Z79899 Other long term (current) drug therapy: Secondary | ICD-10-CM | POA: Diagnosis not present

## 2021-11-23 DIAGNOSIS — I5022 Chronic systolic (congestive) heart failure: Secondary | ICD-10-CM | POA: Insufficient documentation

## 2021-11-23 DIAGNOSIS — I428 Other cardiomyopathies: Secondary | ICD-10-CM | POA: Diagnosis not present

## 2021-11-23 DIAGNOSIS — I110 Hypertensive heart disease with heart failure: Secondary | ICD-10-CM | POA: Diagnosis present

## 2021-11-23 DIAGNOSIS — Z8679 Personal history of other diseases of the circulatory system: Secondary | ICD-10-CM

## 2021-11-23 DIAGNOSIS — Z09 Encounter for follow-up examination after completed treatment for conditions other than malignant neoplasm: Secondary | ICD-10-CM | POA: Diagnosis not present

## 2021-11-23 DIAGNOSIS — Z8673 Personal history of transient ischemic attack (TIA), and cerebral infarction without residual deficits: Secondary | ICD-10-CM | POA: Diagnosis not present

## 2021-11-23 DIAGNOSIS — Z7984 Long term (current) use of oral hypoglycemic drugs: Secondary | ICD-10-CM | POA: Insufficient documentation

## 2021-11-23 DIAGNOSIS — I639 Cerebral infarction, unspecified: Secondary | ICD-10-CM | POA: Diagnosis not present

## 2021-11-23 DIAGNOSIS — I69328 Other speech and language deficits following cerebral infarction: Secondary | ICD-10-CM | POA: Diagnosis not present

## 2021-11-23 DIAGNOSIS — I63412 Cerebral infarction due to embolism of left middle cerebral artery: Secondary | ICD-10-CM

## 2021-11-23 DIAGNOSIS — Z9884 Bariatric surgery status: Secondary | ICD-10-CM | POA: Insufficient documentation

## 2021-11-23 DIAGNOSIS — R000 Tachycardia, unspecified: Secondary | ICD-10-CM | POA: Insufficient documentation

## 2021-11-23 DIAGNOSIS — Z720 Tobacco use: Secondary | ICD-10-CM

## 2021-11-23 DIAGNOSIS — I3139 Other pericardial effusion (noninflammatory): Secondary | ICD-10-CM | POA: Diagnosis not present

## 2021-11-23 DIAGNOSIS — R0989 Other specified symptoms and signs involving the circulatory and respiratory systems: Secondary | ICD-10-CM | POA: Diagnosis not present

## 2021-11-23 LAB — BASIC METABOLIC PANEL
Anion gap: 4 — ABNORMAL LOW (ref 5–15)
BUN: 6 mg/dL (ref 6–20)
CO2: 25 mmol/L (ref 22–32)
Calcium: 8.7 mg/dL — ABNORMAL LOW (ref 8.9–10.3)
Chloride: 113 mmol/L — ABNORMAL HIGH (ref 98–111)
Creatinine, Ser: 1 mg/dL (ref 0.44–1.00)
GFR, Estimated: >60 mL/min (ref >60)
Glucose, Bld: 119 mg/dL — ABNORMAL HIGH (ref 70–99)
Potassium: 3.5 mmol/L (ref 3.5–5.1)
Sodium: 142 mmol/L (ref 135–145)

## 2021-11-23 LAB — DIGOXIN LEVEL: Digoxin Level: 0.8 ng/mL (ref 0.8–2.0)

## 2021-11-23 NOTE — Progress Notes (Signed)
ADVANCED HF CLINIC NOTE   Primary Care: [facility] Cardiologist: Dr. Haroldine Laws  HPI: Gina Nichols is a 51 y.o. 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     She had not been seen by Dr Nicky Pugh in several years.   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 Admitted with code stroke. CTA head/neck with left MCA territory infarct. Underwent mechanical thrombectomy. Echo with newly reduced EF 20-25%. Cardiology consulted. TEE showed EF < 20% RV normal, no thrombus, and no PFO.  Underwent R/LHC showing normal coronaries. PA Sat 62% , RA 12, PA 53/32 (41), PCWP 33, CO 4.6, and CI 2.4. Diuresed with IV lasix and GDMT titrated. cMRI showed LVEF 16%, RVEF 39%, no obvious thrombus, moderate functional MR, moderate pericardial effusion (no tamponade), no ASD/PFO, no LGE. Discharged home, weight 179 lbs.  Today she returns for HF follow up. Overall feeling fine. She has mild SOB walking up steps. Had an episode of rattling in her chest, had relief with a dose of Lasix. Denies palpitations, abnormal bleeding, CP, dizziness, edema, or PND/Orthopnea. Appetite ok. No fever or chills. Weight at home 162-164 pounds. Taking all medications. Continues to struggle with stuttering, R hemiparesis resolving.  Cardiac Studies:  - cMRI (6/23): LVEF 16%, RVEF 39%, no obvious thrombus, moderate functional MR, moderate pericardial effusion (no tamponade), no ASD/PFO, no LGE   - R/LHC (6/23): normal cors  PA Sat 62%   RA 12  PA 53/32 (41) PCWP 33  CO 4.6 and CI 2.4  - TEE (6/23): EF < 20% RV normal, no thrombus, and no PFO.    - Echo (6/23): EF 20-25%    Past Medical History:  Diagnosis Date   Anginal pain ([place])    Chest pressure when bending over most recent Oct. 31, 2015    Depression    history of   Diabetes mellitus without complication ([place])    pre-diabetic   Dysrhythmia    SVT s/p ablation 2015   Headache    s/p brain tumor 11/1999   Left leg numbness    s/p back surgery   Shortness of breath dyspnea    with stairs, wakes up out of sleep, occassional wheezes noted by patient    Current Outpatient Medications  Medication Sig Dispense Refill   apixaban (ELIQUIS) 5 MG TABS tablet Take 1 tablet (5 mg total) by mouth 2 (two) times daily. 60 tablet 1   atorvastatin (LIPITOR) 40 MG tablet Take 1 tablet (40 mg total) by mouth daily. 30 tablet 1   benzonatate (TESSALON) 100 MG capsule Take 1 capsule (100 mg total) by mouth 3 (three) times daily as needed for cough. 21 capsule 0   carvedilol (COREG) 3.125 MG tablet Take 1 tablet (3.125 mg total) by mouth 2 (two) times daily with a meal. 90 tablet 3   dapagliflozin propanediol (FARXIGA) 10 MG TABS tablet Take 1 tablet (10 mg total) by mouth daily. 30 tablet 1   levocetirizine (XYZAL) 5 MG tablet Take 5 mg by mouth daily.     losartan (COZAAR) 25 MG tablet Take 1 tablet (25 mg total) by mouth daily. 30 tablet 3   Multiple Vitamins-Minerals (EMERGEN-C IMMUNE PO) Take 1 packet by mouth daily.     nitroGLYCERIN (NITROSTAT) 0.4 MG SL tablet Place 1 tablet (  0.4 mg total) under the tongue every 5 (five) minutes as needed for chest pain. 25 tablet 12   pantoprazole (PROTONIX) 40 MG tablet Take 40 mg by mouth 2 (two) times daily.     spironolactone (ALDACTONE) 25 MG tablet Take 1 tablet (25 mg total) by mouth daily. 30 tablet 2   Vitamin D, Ergocalciferol, (DRISDOL) 1.25 MG (50000 UNIT) CAPS capsule Take 50,000 Units by mouth every 7 (seven) days. On Mondays     No current facility-administered medications for this encounter.   Allergies  Allergen Reactions   Omeprazole Hives    Social History   Socioeconomic History   Marital status: Single    Spouse name: Not on file   Number of children: Not on file   Years  of education: Not on file   Highest education level: Not on file  Occupational History   Not on file  Tobacco Use   Smoking status: Some Days    Types: Cigars   Smokeless tobacco: Never  Vaping Use   Vaping Use: Never used  Substance and Sexual Activity   Alcohol use: Yes    Comment: drinks socially   Drug use: Never   Sexual activity: Not Currently    Birth control/protection: None  Other Topics Concern   Not on file  Social History Narrative   ** Merged History Encounter **       Social Determinants of Health   Financial Resource Strain: Not on file  Food Insecurity: Not on file  Transportation Needs: Not on file  Physical Activity: Not on file  Stress: Not on file  Social Connections: Not on file  Intimate Partner Violence: Not on file   Family Hx: Father: Deceased HTN/Alzheimers Mother : HTN Sister: HTN  Maternal Aunt: HTN,  Maternal GM: HTN Maternal Uncle: HTN Paternal Grandmother : Diabetes Paternal Uncle: Diabeties.   BP 112/74   Pulse 83   Wt 73.8 kg (162 lb 12.8 oz)   SpO2 99%   BMI 27.09 kg/m   Wt Readings from Last 3 Encounters:  11/23/21 73.8 kg (162 lb 12.8 oz)  11/23/21 74.4 kg (164 lb)  10/19/21 73.5 kg (162 lb)   PHYSICAL EXAM: General:  NAD. No resp difficulty HEENT: Normal Neck: Supple. No JVD. Carotids 2+ bilat; no bruits. No lymphadenopathy or thryomegaly appreciated. Cor: PMI nondisplaced. Regular rate & rhythm. No rubs, gallops or murmurs. Lungs: Clear Abdomen: Soft, nontender, nondistended. No hepatosplenomegaly. No bruits or masses. Good bowel sounds. Extremities: No cyanosis, clubbing, rash, edema Neuro: Alert & oriented x 3, cranial nerves grossly intact. Moves all 4 extremities w/o difficulty. Affect pleasant. Delayed speech  ASSESSMENT & PLAN: Chronic Systolic Heart Failure - NICM - Echo (6/23):EF 20-25%, RV stable.  - R/LHC (6/23): with normal cors, elevated filling pressures, and preserved cardiac output.  - TEE  (6/23): EF 20%, no PFO/thrombus but with smoke in LA.  - cMRI (6/23) showed LVEF 16%, RVEF 39%, moderate pericardial effusion, moderate functional MR, no thrombus  - NYHA II. Volume status ok.  - Continue Coreg 3.125 mg bid. - Continue losartan 25 mg daily. No BP room for Entresto. - Continue Lasix 40 PRN. - Continue Farxiga 10 mg daily.  - Continue digoxin 0.125 mg daily. - Continue spironolactone 12.5 mg daily.  - Repeat echo next visit. - BMET and dig level today.   2. 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 -  TEE- (6/23): EF 20% No PFO/thrombus but with smoke in LA - suspect cardio-embolic -> now on Eliquis 5 mg bid. - No bleeding issues.  3. Sinus Tach - Improved. HR 83 today. - Stable.    4. H/o SVT with ablation - Had ablation 2015 at [facility]    5. Tobacco Abuse - Stopped smoking cigars. - Congratulated  6. Urinary retention - On SGLT2i. - No GU issues.   Follow up in 2 months with Dr. Haroldine Laws + echo, as scheduled.  Allena Katz, FNP-BC 11/23/21

## 2021-11-23 NOTE — Patient Instructions (Signed)
It was great to see you today! No medication changes are needed at this time.   Labs today We will only contact you if something comes back abnormal or we need to make some changes. Otherwise no news is good news!  Keep follow up as scheduled with Dr Haroldine Laws   DO the following things EVERYDAY: Weigh yourself in the morning before breakfast. Write it down and keep it in a log. Take your medicines as prescribed Eat low salt foods--Limit salt (sodium) to 2000 mg per day.  Stay as active as you can everyday Limit all fluids for the day to less than 2 liters  At the [facility], you and your health needs are our priority. As part of our continuing mission to provide you with exceptional heart care, we have created designated Provider Care Teams. These Care Teams include your primary Cardiologist (physician) and Advanced Practice Providers (APPs- Physician Assistants and Nurse Practitioners) who all work together to provide you with the care you need, when you need it.   You may see any of the following providers on your designated Care Team at your next follow up: Dr Glori Bickers Dr Loralie Champagne Dr. Roxana Hires, NP Lyda Jester, [place] [facility] [place], [place] Forestine Na, NP Audry Riles, PharmD   Please be sure to bring in all your medications bottles to every appointment.   If you have any questions or concerns before your next appointment please send [place] a message through [place] or call our office at [phone].    TO LEAVE A MESSAGE FOR THE NURSE SELECT OPTION 2, PLEASE LEAVE A MESSAGE INCLUDING: YOUR NAME DATE OF BIRTH CALL BACK NUMBER REASON FOR CALL**this is important as we prioritize the call backs  YOU WILL RECEIVE A CALL BACK THE SAME DAY AS LONG AS YOU CALL BEFORE 4:00 PM

## 2021-11-23 NOTE — Patient Instructions (Addendum)
Continue  Eliquis '5mg'$  twice daily   and atorvastatin  for secondary stroke prevention  Continue to follow with cardiology as scheduled   Continue to follow up with PCP regarding cholesterol and blood pressure management  Maintain strict control of hypertension with blood pressure goal below 130/90 and cholesterol with LDL cholesterol (bad cholesterol) goal below 70 mg/dL.   Signs of a Stroke? Follow the BEFAST method:  Balance Watch for a sudden loss of balance, trouble with coordination or vertigo Eyes Is there a sudden loss of vision in one or both eyes? Or double vision?  Face: Ask the person to smile. Does one side of the face droop or is it numb?  Arms: Ask the person to raise both arms. Does one arm drift downward? Is there weakness or numbness of a leg? Speech: Ask the person to repeat a simple phrase. Does the speech sound slurred/strange? Is the person confused ? Time: If you observe any of these signs, call 911.        Thank you for coming to see [place] at [facility]. I hope we have been able to provide you high quality care today.  You may receive a patient satisfaction survey over the next few weeks. We would appreciate your feedback and comments so that we may continue to improve ourselves and the health of our patients.     Stroke Prevention Some medical conditions and lifestyle choices can lead to a higher risk for a stroke. You can help to prevent a stroke by eating healthy foods and exercising. It also helps to not smoke and to manage any health problems you may have. How can this condition affect me? A stroke is an emergency. It should be treated right away. A stroke can lead to brain damage or threaten your life. There is a better chance of surviving and getting better after a stroke if you get medical help right away. What can increase my risk? The following medical conditions may increase your risk of a stroke: Diseases of the heart and blood  vessels (cardiovascular disease). High blood pressure (hypertension). Diabetes. High cholesterol. Sickle cell disease. Problems with blood clotting. Being very overweight. Sleeping problems (obstructivesleep apnea). Other risk factors include: Being older than age 18. A history of blood clots, stroke, or mini-stroke (TIA). Race, ethnic background, or a family history of stroke. Smoking or using tobacco products. Taking birth control pills, especially if you smoke. Heavy alcohol and drug use. Not being active. What actions can I take to prevent this? Manage your health conditions High cholesterol. Eat a healthy diet. If this is not enough to manage your cholesterol, you may need to take medicines. Take medicines as told by your doctor. High blood pressure. Try to keep your blood pressure below 130/80. If your blood pressure cannot be managed through a healthy diet and regular exercise, you may need to take medicines. Take medicines as told by your doctor. Ask your doctor if you should check your blood pressure at home. Have your blood pressure checked every year. Diabetes. Eat a healthy diet and get regular exercise. If your blood sugar (glucose) cannot be managed through diet and exercise, you may need to take medicines. Take medicines as told by your doctor. Talk to your doctor about getting checked for sleeping problems. Signs of a problem can include: Snoring a lot. Feeling very tired. Make sure that you manage any other conditions you have. Nutrition  Follow instructions from your doctor about what to eat  or drink. You may be told to: Eat and drink fewer calories each day. Limit how much salt (sodium) you use to 1,500 milligrams (mg) each day. Use only healthy fats for cooking, such as olive oil, canola oil, and sunflower oil. Eat healthy foods. To do this: Choose foods that are high in fiber. These include whole grains, and fresh fruits and vegetables. Eat at least 5  servings of fruits and vegetables a day. Try to fill one-half of your plate with fruits and vegetables at each meal. Choose low-fat (lean) proteins. These include low-fat cuts of meat, chicken without skin, fish, tofu, beans, and nuts. Eat low-fat dairy products. Avoid foods that: Are high in salt. Have saturated fat. Have trans fat. Have cholesterol. Are processed or pre-made. Count how many carbohydrates you eat and drink each day. Lifestyle If you drink alcohol: Limit how much you have to: 0-1 drink a day for women who are not pregnant. 0-2 drinks a day for men. Know how much alcohol is in your drink. In the [place] one drink equals one 12 oz bottle of beer (357m), one 5 oz glass of wine (1477m, or one 1 oz glass of hard liquor (445m Do not smoke or use any products that have nicotine or tobacco. If you need help quitting, ask your doctor. Avoid secondhand smoke. Do not use drugs. Activity  Try to stay at a healthy weight. Get at least 30 minutes of exercise on most days, such as: Fast walking. Biking. Swimming. Medicines Take over-the-counter and prescription medicines only as told by your doctor. Avoid taking birth control pills. Talk to your doctor about the risks of taking birth control pills if: You are over 35 31ars old. You smoke. You get very bad headaches. You have had a blood clot. Where to find more information American Stroke Association: www.strokeassociation.org Get help right away if: You or a loved one has any signs of a stroke. "BE FAST" is an easy way to remember the warning signs: B - Balance. Dizziness, sudden trouble walking, or loss of balance. E - Eyes. Trouble seeing or a change in how you see. F - Face. Sudden weakness or loss of feeling of the face. The face or eyelid may droop on one side. A - Arms. Weakness or loss of feeling in an arm. This happens all of a sudden and most often on one side of the body. S - Speech. Sudden trouble speaking,  slurred speech, or trouble understanding what people say. T - Time. Time to call emergency services. Write down what time symptoms started. You or a loved one has other signs of a stroke, such as: A sudden, very bad headache with no known cause. Feeling like you may vomit (nausea). Vomiting. A seizure. These symptoms may be an emergency. Get help right away. Call your local emergency services (911 in the [place] Do not wait to see if the symptoms will go away. Do not drive yourself to the hospital. Summary You can help to prevent a stroke by eating healthy, exercising, and not smoking. It also helps to manage any health problems you have. Do not smoke or use any products that contain nicotine or tobacco. Get help right away if you or a loved one has any signs of a stroke. This information is not intended to replace advice given to you by your health care provider. Make sure you discuss any questions you have with your health care provider. Document Revised: 09/30/2019 Document Reviewed: 09/30/2019 Elsevier Patient Education  [place].

## 2021-11-24 ENCOUNTER — Telehealth (HOSPITAL_COMMUNITY): Payer: Self-pay

## 2021-11-24 MED ORDER — POTASSIUM CHLORIDE CRYS ER 20 MEQ PO TBCR: 20.0000 meq | EXTENDED_RELEASE_TABLET | ORAL | 0 refills | Status: DC | PRN

## 2021-11-24 MED ORDER — FUROSEMIDE 20 MG PO TABS: 20.0000 mg | ORAL_TABLET | ORAL | 0 refills | Status: DC | PRN

## 2021-11-24 NOTE — Telephone Encounter (Signed)
Pt aware, agreeable, and verbalized understanding  Med list updated

## 2021-11-25 ENCOUNTER — Other Ambulatory Visit (HOSPITAL_COMMUNITY): Payer: Self-pay | Admitting: Internal Medicine

## 2021-11-25 DIAGNOSIS — I639 Cerebral infarction, unspecified: Secondary | ICD-10-CM

## 2021-12-03 NOTE — Telephone Encounter (Signed)
Advanced Heart Failure Patient Advocate Encounter  Manufacturer Assistance Application was sent to [employer] on 10/19/2021, and required OOP expense report. OOP expenses were previously submitted to [employer].  Contacted [employer] for status update. Representative stated that [employer] has received 651.54 of OOP expenses, and needs a further 604.25 to meet assistance criteria.  Reached out to patient. Patient informed me that she and her mother do reside in the same house, and her mother is retired. This information may affect eligibility for the program. Will look further into what criteria may be needed for a 2 person household and update when additional information is available.  Clista Bernhardt, CPhT Rx Patient Advocate Phone: [phone]

## 2021-12-04 NOTE — Progress Notes (Signed)
I agree with the above plan 

## 2021-12-07 ENCOUNTER — Other Ambulatory Visit (HOSPITAL_COMMUNITY): Payer: Self-pay

## 2021-12-07 NOTE — Telephone Encounter (Addendum)
Advanced Heart Failure Patient Advocate Encounter  Contacted patient for additional information about HH and income, and was informed that patient has new insurance plan that went into effect this month that was not yet on file.   Added to [facility], current test claims return $149.97 copay for 30 days Eliquis, and $151.18 for 30 days [place].  Based off of the information provided, patient does not meet the requirements for [employer] assistance based on OOP/AGI percentages, and this insurance (PDP) is not eligible for copay savings cards. Unable to apply for any additional assistance at this time.  Will check with office to see if any samples or 30 day trial cards are available to help bridge patent to the end of the year until insurance coverage resets.  Clista Bernhardt, CPhT Rx Patient Advocate Phone: [phone]

## 2021-12-13 ENCOUNTER — Inpatient Hospital Stay: Payer: Medicare HMO | Admitting: Neurology

## 2021-12-14 NOTE — Patient Outreach (Signed)
  Care Coordination   12/14/2021 Name: Gina Nichols MRN: [id number] DOB: 1970-12-05   No telephone outreach to patient to obtain mRS. As was successfully completed by Dr. Darden Dates 11/23/21. MRS=1  [place] Care Management Assistant [phone]

## 2021-12-23 ENCOUNTER — Telehealth (HOSPITAL_COMMUNITY): Payer: Self-pay | Admitting: Pharmacy Technician

## 2021-12-23 NOTE — Telephone Encounter (Signed)
Advanced Heart Failure Patient Advocate Encounter   Patient was automatically approved to receive Farxiga from [employer] through 03/14/23  Document scanned to chart. Patient will be notified via mail. Patient currently using [employer] assistance through previous enrollment end date. No further action needed at this time.  Charlann Boxer, CPhT

## 2022-01-06 ENCOUNTER — Other Ambulatory Visit (HOSPITAL_COMMUNITY): Payer: Self-pay | Admitting: Internal Medicine

## 2022-01-24 ENCOUNTER — Encounter (HOSPITAL_COMMUNITY): Payer: Self-pay | Admitting: Internal Medicine

## 2022-01-24 ENCOUNTER — Ambulatory Visit (HOSPITAL_COMMUNITY)
Admission: RE | Admit: 2022-01-24 | Discharge: 2022-01-24 | Disposition: A | Discharge status: Home / Self Care | Payer: Medicare Other | Source: Ambulatory Visit | Attending: Internal Medicine | Admitting: Internal Medicine

## 2022-01-24 ENCOUNTER — Other Ambulatory Visit: Payer: Self-pay | Admitting: Cardiology

## 2022-01-24 ENCOUNTER — Ambulatory Visit (HOSPITAL_BASED_OUTPATIENT_CLINIC_OR_DEPARTMENT_OTHER)
Admission: RE | Admit: 2022-01-24 | Discharge: 2022-01-24 | Disposition: A | Discharge status: Home / Self Care | Payer: Medicare Other | Source: Ambulatory Visit | Attending: Internal Medicine | Admitting: Internal Medicine

## 2022-01-24 VITALS — BP 120/76 | HR 57 | Wt 162.6 lb

## 2022-01-24 DIAGNOSIS — Z79899 Other long term (current) drug therapy: Secondary | ICD-10-CM | POA: Insufficient documentation

## 2022-01-24 DIAGNOSIS — Z87891 Personal history of nicotine dependence: Secondary | ICD-10-CM | POA: Diagnosis not present

## 2022-01-24 DIAGNOSIS — I428 Other cardiomyopathies: Secondary | ICD-10-CM | POA: Diagnosis not present

## 2022-01-24 DIAGNOSIS — I5022 Chronic systolic (congestive) heart failure: Secondary | ICD-10-CM

## 2022-01-24 DIAGNOSIS — I63412 Cerebral infarction due to embolism of left middle cerebral artery: Secondary | ICD-10-CM | POA: Diagnosis not present

## 2022-01-24 DIAGNOSIS — Z8679 Personal history of other diseases of the circulatory system: Secondary | ICD-10-CM

## 2022-01-24 DIAGNOSIS — Z9884 Bariatric surgery status: Secondary | ICD-10-CM | POA: Insufficient documentation

## 2022-01-24 DIAGNOSIS — Z7984 Long term (current) use of oral hypoglycemic drugs: Secondary | ICD-10-CM | POA: Insufficient documentation

## 2022-01-24 DIAGNOSIS — I110 Hypertensive heart disease with heart failure: Secondary | ICD-10-CM | POA: Diagnosis not present

## 2022-01-24 DIAGNOSIS — Z8673 Personal history of transient ischemic attack (TIA), and cerebral infarction without residual deficits: Secondary | ICD-10-CM | POA: Diagnosis not present

## 2022-01-24 DIAGNOSIS — I4892 Unspecified atrial flutter: Secondary | ICD-10-CM | POA: Diagnosis not present

## 2022-01-24 DIAGNOSIS — I4891 Unspecified atrial fibrillation: Secondary | ICD-10-CM | POA: Insufficient documentation

## 2022-01-24 DIAGNOSIS — Z720 Tobacco use: Secondary | ICD-10-CM

## 2022-01-24 DIAGNOSIS — I5040 Unspecified combined systolic (congestive) and diastolic (congestive) heart failure: Secondary | ICD-10-CM | POA: Diagnosis present

## 2022-01-24 LAB — BASIC METABOLIC PANEL
Anion gap: 6 (ref 5–15)
BUN: 10 mg/dL (ref 6–20)
CO2: 28 mmol/L (ref 22–32)
Calcium: 9.7 mg/dL (ref 8.9–10.3)
Chloride: 107 mmol/L (ref 98–111)
Creatinine, Ser: 0.89 mg/dL (ref 0.44–1.00)
GFR, Estimated: >60 mL/min (ref >60)
Glucose, Bld: 93 mg/dL (ref 70–99)
Potassium: 4.6 mmol/L (ref 3.5–5.1)
Sodium: 141 mmol/L (ref 135–145)

## 2022-01-24 LAB — ECHOCARDIOGRAM COMPLETE
Area-P 1/2: 4.06 cm2
Calc EF: 34.2 %
S' Lateral: 4.5 cm
Single Plane A2C EF: 29 %
Single Plane A4C EF: 39.8 %

## 2022-01-24 LAB — BRAIN NATRIURETIC PEPTIDE: B Natriuretic Peptide: 203.7 pg/mL — ABNORMAL HIGH (ref 0.0–100.0)

## 2022-01-24 MED ORDER — LOSARTAN POTASSIUM 50 MG PO TABS: 50.0000 mg | ORAL_TABLET | Freq: Every day | ORAL | 3 refills | Status: DC

## 2022-01-24 NOTE — Progress Notes (Addendum)
ADVANCED HF CLINIC NOTE   Primary Care: [facility] HF Cardiologist: Dr. Haroldine Laws  HPI: Gina Nichols is a 51 y.o. 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     She had not been seen by Dr Nicky Pugh in several years.   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 CTA head/neck with left MCA territory infarct. Underwent mechanical thrombectomy. Echo with newly reduced EF 20-25%. Cardiology consulted. TEE showed EF < 20% RV normal, no thrombus, and no PFO.  Underwent R/LHC showing normal coronaries. PA Sat 62% , RA 12, PA 53/32 (41), PCWP 33, CO 4.6, and CI 2.4. cMRI showed LVEF 16%, RVEF 39%, no obvious thrombus, moderate functional MR, moderate pericardial effusion (no tamponade), no ASD/PFO, no LGE.  Echo today 01/24/22: EF 30-35% no effusion triv MR Personally reviewed  Today she returns for HF follow up. Feeling much better. Has recovered completely from her stroke. Remains active. No CP, SOB, orthopnea or PND. No bleeding with Eliquis. Gets tired at times. No snoring.   Cardiac Studies:  - cMRI (6/23): LVEF 16%, RVEF 39%, no obvious thrombus, moderate functional MR, moderate pericardial effusion (no tamponade), no ASD/PFO, no LGE   - R/LHC (6/23): normal cors  PA Sat 62%   RA 12  PA 53/32 (41) PCWP 33  CO 4.6 and CI 2.4  - TEE (6/23): EF < 20% RV normal, no thrombus, and no PFO.    - Echo (6/23): EF 20-25%    Past Medical History:  Diagnosis Date   Anginal pain ([place])    Chest pressure when bending over most recent Oct. 31, 2015   Depression    history of   Diabetes mellitus without complication ([place])    pre-diabetic   Dysrhythmia    SVT s/p ablation 2015   Headache    s/p brain tumor 11/1999   Left leg numbness    s/p back surgery    Shortness of breath dyspnea    with stairs, wakes up out of sleep, occassional wheezes noted by patient    Current Outpatient Medications  Medication Sig Dispense Refill   apixaban (ELIQUIS) 5 MG TABS tablet Take 1 tablet (5 mg total) by mouth 2 (two) times daily. 60 tablet 1   atorvastatin (LIPITOR) 40 MG tablet TAKE 1 TABLET BY MOUTH EVERY DAY 90 tablet 3   benzonatate (TESSALON) 100 MG capsule Take 1 capsule (100 mg total) by mouth 3 (three) times daily as needed for cough. 21 capsule 0   carvedilol (COREG) 3.125 MG tablet Take 1 tablet (3.125 mg total) by mouth 2 (two) times daily with a meal. 90 tablet 3   dapagliflozin propanediol (FARXIGA) 10 MG TABS tablet Take 1 tablet (10 mg total) by mouth daily. 30 tablet 1   digoxin (LANOXIN) 0.125 MG tablet TAKE 1 TABLET BY MOUTH DAILY 30 tablet 1   furosemide (LASIX) 20 MG tablet Take 1 tablet (20 mg total) by mouth as needed. Greater than 3lb in 24 hours or 5lb in a week 90 tablet 0   levocetirizine (XYZAL) 5 MG tablet Take 5 mg by mouth daily.     losartan (COZAAR) 25 MG tablet Take 1 tablet (25 mg total) by mouth daily. 30 tablet 3   Multiple Vitamins-Minerals (EMERGEN-C IMMUNE PO) Take 1 packet by mouth daily.  nitroGLYCERIN (NITROSTAT) 0.4 MG SL tablet Place 1 tablet (0.4 mg total) under the tongue every 5 (five) minutes as needed for chest pain. 25 tablet 12   pantoprazole (PROTONIX) 40 MG tablet Take 40 mg by mouth 2 (two) times daily.     potassium chloride SA (KLOR-CON M) 20 MEQ tablet Take 1 tablet (20 mEq total) by mouth as needed. Take with prn Lasix 90 tablet 0   spironolactone (ALDACTONE) 25 MG tablet Take 1 tablet (25 mg total) by mouth daily. 30 tablet 2   Vitamin D, Ergocalciferol, (DRISDOL) 1.25 MG (50000 UNIT) CAPS capsule Take 50,000 Units by mouth every 7 (seven) days. On Mondays     No current facility-administered medications for this encounter.   Allergies  Allergen Reactions   Omeprazole Hives    Social History    Socioeconomic History   Marital status: Single    Spouse name: Not on file   Number of children: Not on file   Years of education: Not on file   Highest education level: Not on file  Occupational History   Not on file  Tobacco Use   Smoking status: Some Days    Types: Cigars   Smokeless tobacco: Never  Vaping Use   Vaping Use: Never used  Substance and Sexual Activity   Alcohol use: Yes    Comment: drinks socially   Drug use: Never   Sexual activity: Not Currently    Birth control/protection: None  Other Topics Concern   Not on file  Social History Narrative   ** Merged History Encounter **       Social Determinants of Health   Financial Resource Strain: Not on file  Food Insecurity: Not on file  Transportation Needs: Not on file  Physical Activity: Not on file  Stress: Not on file  Social Connections: Not on file  Intimate Partner Violence: Not on file   Family Hx: Father: Deceased HTN/Alzheimers Mother : HTN Sister: HTN  Maternal Aunt: HTN,  Maternal GM: HTN Maternal Uncle: HTN Paternal Grandmother : Diabetes Paternal Uncle: Diabeties.   BP 120/76   Pulse (!) 57   Wt 73.8 kg (162 lb 9.6 oz)   SpO2 99%   BMI 27.06 kg/m   Wt Readings from Last 3 Encounters:  01/24/22 73.8 kg (162 lb 9.6 oz)  11/23/21 73.8 kg (162 lb 12.8 oz)  11/23/21 74.4 kg (164 lb)   PHYSICAL EXAM: General:  Well appearing. No resp difficulty HEENT: normal Neck: supple. no JVD. Carotids 2+ bilat; no bruits. No lymphadenopathy or thryomegaly appreciated. Cor: PMI nondisplaced. Regular rate & rhythm. No rubs, gallops or murmurs. Lungs: clear Abdomen: soft, nontender, nondistended. No hepatosplenomegaly. No bruits or masses. Good bowel sounds. Extremities: no cyanosis, clubbing, rash, edema Neuro: alert & orientedx3, cranial nerves grossly intact. moves all 4 extremities w/o difficulty. Affect pleasant   ASSESSMENT & PLAN: Chronic Systolic Heart Failure - NICM - Echo  (6/23):EF 20-25%, RV stable.  - R/LHC (6/23): with normal cors, elevated filling pressures, and preserved cardiac output.  - TEE (6/23): EF 20%, no PFO/thrombus but with smoke in LA.  - cMRI (6/23) showed LVEF 16%, RVEF 39%, moderate pericardial effusion, moderate functional MR, no thrombus  - Echo today 01/24/22: EF 30-35% no effusion - NYHA I-II. Volume status ok. Continue Lasix 40 PRN. - Continue Coreg 3.125 mg bid. - Increase losartan to 50 mg daily. If tolerates - will consider switch to [facility] next visit - Continue Farxiga 10 mg daily.  -  Continue spironolactone 12.5 mg daily.  - Stop digoxin  - Labs today - Will hold off on ICD as EF recovering and NYHA I-II   2. 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 - TEE- (6/23): EF 20% No PFO/thrombus but with smoke in LA - suspect cardio-embolic -> now on Eliquis 5 mg bid. - No bleeding issues.  3. H/o SVT with ablation - Had ablation 2015 at [facility]    4. Tobacco Abuse - Stopped smoking cigars. - Congratulated  Glori Bickers, MD  3:38 PM

## 2022-01-24 NOTE — Addendum Note (Signed)
Encounter addended by: Jerl Mina, RN on: 01/24/2022 3:50 PM  Actions taken: Medication long-term status modified, Order list changed, Diagnosis association updated, Clinical Note Signed, Charge Capture section accepted

## 2022-01-24 NOTE — Progress Notes (Signed)
Medication Samples have been provided to the patient.  Drug name: Eliquis       Strength: 5 mg        Qty: 4  LOT: [id number]  Exp.Date: 07/2023  Dosing instructions: Take 1 tablet Twice daily   The patient has been instructed regarding the correct time, dose, and frequency of taking this medication, including desired effects and most common side effects.   Gina Nichols 3:58 PM 01/24/2022

## 2022-01-24 NOTE — Progress Notes (Signed)
  Echocardiogram 2D Echocardiogram has been performed.  Bobbye Charleston 01/24/2022, 2:57 PM

## 2022-01-24 NOTE — Patient Instructions (Signed)
STOP Digoxin  INCREASE Losartan to '50mg'$  daily.  Labs done today, your results will be available in MyChart, we will contact you for abnormal readings.  Your physician recommends that you schedule a follow-up appointment in: 3 months  If you have any questions or concerns before your next appointment please send [place] a message through [place] or call our office at [phone].    TO LEAVE A MESSAGE FOR THE NURSE SELECT OPTION 2, PLEASE LEAVE A MESSAGE INCLUDING: YOUR NAME DATE OF BIRTH CALL BACK NUMBER REASON FOR CALL**this is important as we prioritize the call backs  YOU WILL RECEIVE A CALL BACK THE SAME DAY AS LONG AS YOU CALL BEFORE 4:00 PM  At the [facility], you and your health needs are our priority. As part of our continuing mission to provide you with exceptional heart care, we have created designated Provider Care Teams. These Care Teams include your primary Cardiologist (physician) and Advanced Practice Providers (APPs- Physician Assistants and Nurse Practitioners) who all work together to provide you with the care you need, when you need it.   You may see any of the following providers on your designated Care Team at your next follow up: Dr Glori Bickers Dr Loralie Champagne Dr. Roxana Hires, NP Lyda Jester, [place] [facility] [place], [place] Forestine Na, NP Audry Riles, PharmD   Please be sure to bring in all your medications bottles to every appointment.

## 2022-01-24 NOTE — Addendum Note (Signed)
Encounter addended by: Stanford Scotland, RN on: 01/24/2022 3:59 PM  Actions taken: Clinical Note Signed

## 2022-02-25 ENCOUNTER — Other Ambulatory Visit (HOSPITAL_COMMUNITY): Payer: Self-pay | Admitting: Family Medicine

## 2022-03-05 ENCOUNTER — Other Ambulatory Visit (HOSPITAL_COMMUNITY): Payer: Self-pay | Admitting: Family Medicine

## 2022-04-14 ENCOUNTER — Other Ambulatory Visit (HOSPITAL_COMMUNITY): Payer: Self-pay | Admitting: *Deleted

## 2022-04-14 MED ORDER — DAPAGLIFLOZIN PROPANEDIOL 10 MG PO TABS: 10.0000 mg | ORAL_TABLET | Freq: Every day | ORAL | 3 refills | Status: DC

## 2022-04-22 NOTE — Progress Notes (Incomplete)
ADVANCED HF CLINIC NOTE   Primary Care: [facility] Cardiologist: Dr. Haroldine Laws  HPI: Gina Nichols is a 52 y.o. 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     She had not been seen by Dr Nicky Pugh in several years.   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 CTA head/neck with left MCA territory infarct. Underwent mechanical thrombectomy. Echo with newly reduced EF 20-25%. Cardiology consulted. TEE showed EF < 20% RV normal, no thrombus, and no PFO.  Underwent R/LHC showing normal coronaries. PA Sat 62% , RA 12, PA 53/32 (41), PCWP 33, CO 4.6, and CI 2.4. cMRI showed LVEF 16%, RVEF 39%, no obvious thrombus, moderate functional MR, moderate pericardial effusion (no tamponade), no ASD/PFO, no LGE.  Echo today 01/24/22: EF 30-35% no effusion triv MR Personally reviewed  Today she returns for HF follow up. Feeling much better. Has recovered completely from her stroke. Remains active. No CP, SOB, orthopnea or PND. No bleeding with Eliquis. Gets tired at times. No snoring.   Cardiac Studies:  - cMRI (6/23): LVEF 16%, RVEF 39%, no obvious thrombus, moderate functional MR, moderate pericardial effusion (no tamponade), no ASD/PFO, no LGE   - R/LHC (6/23): normal cors  PA Sat 62%   RA 12  PA 53/32 (41) PCWP 33  CO 4.6 and CI 2.4  - TEE (6/23): EF < 20% RV normal, no thrombus, and no PFO.    - Echo (6/23): EF 20-25%    Past Medical History:  Diagnosis Date   Anginal pain ([place])    Chest pressure when bending over most recent Oct. 31, 2015   Depression    history of   Diabetes mellitus without complication ([place])    pre-diabetic   Dysrhythmia    SVT s/p ablation 2015   Headache    s/p brain tumor 11/1999   Left leg numbness    s/p back surgery    Shortness of breath dyspnea    with stairs, wakes up out of sleep, occassional wheezes noted by patient    Current Outpatient Medications  Medication Sig Dispense Refill   apixaban (ELIQUIS) 5 MG TABS tablet Take 1 tablet (5 mg total) by mouth 2 (two) times daily. 60 tablet 1   atorvastatin (LIPITOR) 40 MG tablet TAKE 1 TABLET BY MOUTH EVERY DAY 90 tablet 3   benzonatate (TESSALON) 100 MG capsule Take 1 capsule (100 mg total) by mouth 3 (three) times daily as needed for cough. 21 capsule 0   carvedilol (COREG) 3.125 MG tablet Take 1 tablet (3.125 mg total) by mouth 2 (two) times daily with a meal. 90 tablet 3   dapagliflozin propanediol (FARXIGA) 10 MG TABS tablet Take 1 tablet (10 mg total) by mouth daily. 90 tablet 3   furosemide (LASIX) 20 MG tablet TAKE 1 TABLET BY MOUTH DAILY AS NEEDED. GREATER THAN 3 LBS IN 24 HOURS OR 5 LBS IN A WEEK 90 tablet 0   levocetirizine (XYZAL) 5 MG tablet Take 5 mg by mouth daily.     losartan (COZAAR) 50 MG tablet Take 1 tablet (50 mg total) by mouth daily. 90 tablet 3   Multiple Vitamins-Minerals (EMERGEN-C IMMUNE PO) Take 1 packet by mouth daily.     nitroGLYCERIN (NITROSTAT) 0.4 MG SL tablet Place 1 tablet (0.4 mg total)  under the tongue every 5 (five) minutes as needed for chest pain. 25 tablet 12   pantoprazole (PROTONIX) 40 MG tablet Take 40 mg by mouth 2 (two) times daily.     potassium chloride SA (KLOR-CON M) 20 MEQ tablet TAKE 1 TABLET BY MOUTH DAILY AS NEEDED. TAKE WITH AS NEEDED LASIX 90 tablet 0   spironolactone (ALDACTONE) 25 MG tablet TAKE 1 TABLET (25 MG TOTAL) BY MOUTH DAILY. 90 tablet 0   Vitamin D, Ergocalciferol, (DRISDOL) 1.25 MG (50000 UNIT) CAPS capsule Take 50,000 Units by mouth every 7 (seven) days. On Mondays     No current facility-administered medications for this visit.   Allergies  Allergen Reactions   Omeprazole Hives    Social History   Socioeconomic History   Marital status: Single    Spouse name: Not on file    Number of children: Not on file   Years of education: Not on file   Highest education level: Not on file  Occupational History   Not on file  Tobacco Use   Smoking status: Some Days    Types: Cigars   Smokeless tobacco: Never  Vaping Use   Vaping Use: Never used  Substance and Sexual Activity   Alcohol use: Yes    Comment: drinks socially   Drug use: Never   Sexual activity: Not Currently    Birth control/protection: None  Other Topics Concern   Not on file  Social History Narrative   ** Merged History Encounter **       Social Determinants of Health   Financial Resource Strain: Not on file  Food Insecurity: Not on file  Transportation Needs: Not on file  Physical Activity: Not on file  Stress: Not on file  Social Connections: Not on file  Intimate Partner Violence: Not on file   Family Hx: Father: Deceased HTN/Alzheimers Mother : HTN Sister: HTN  Maternal Aunt: HTN,  Maternal GM: HTN Maternal Uncle: HTN Paternal Grandmother : Diabetes Paternal Uncle: Diabeties.   There were no vitals taken for this visit.  Wt Readings from Last 3 Encounters:  01/24/22 73.8 kg (162 lb 9.6 oz)  11/23/21 73.8 kg (162 lb 12.8 oz)  11/23/21 74.4 kg (164 lb)   PHYSICAL EXAM: General:  Well appearing. No resp difficulty HEENT: normal Neck: supple. no JVD. Carotids 2+ bilat; no bruits. No lymphadenopathy or thryomegaly appreciated. Cor: PMI nondisplaced. Regular rate & rhythm. No rubs, gallops or murmurs. Lungs: clear Abdomen: soft, nontender, nondistended. No hepatosplenomegaly. No bruits or masses. Good bowel sounds. Extremities: no cyanosis, clubbing, rash, edema Neuro: alert & orientedx3, cranial nerves grossly intact. moves all 4 extremities w/o difficulty. Affect pleasant   ASSESSMENT & PLAN: Chronic Systolic Heart Failure - NICM - Echo (6/23):EF 20-25%, RV stable.  - R/LHC (6/23): with normal cors, elevated filling pressures, and preserved cardiac output.  - TEE  (6/23): EF 20%, no PFO/thrombus but with smoke in LA.  - cMRI (6/23) showed LVEF 16%, RVEF 39%, moderate pericardial effusion, moderate functional MR, no thrombus  - Echo today 01/24/22: EF 30-35% no effusion - NYHA I-II. Volume status ok. Continue Lasix 40 PRN. - Continue Coreg 3.125 mg bid. - Increase losartan to 50 mg daily. If tolerates - will consider switch to [facility] next visit - Continue Farxiga 10 mg daily.  - Continue spironolactone 12.5 mg daily.  - Stop digoxin  - Labs today - Will hold off on ICD as EF recovering and NYHA I-II   2. 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 - TEE- (6/23): EF 20% No PFO/thrombus but with smoke in LA - suspect cardio-embolic -> now on Eliquis 5 mg bid. - No bleeding issues.  3. H/o SVT with ablation - Had ablation 2015 at [facility]    4. Tobacco Abuse - Stopped smoking cigars. - [place], FNP  9:40 AM

## 2022-04-25 ENCOUNTER — Encounter (HOSPITAL_COMMUNITY): Payer: Medicare Other

## 2022-06-24 ENCOUNTER — Other Ambulatory Visit (HOSPITAL_COMMUNITY): Payer: Self-pay | Admitting: Internal Medicine

## 2022-06-24 DIAGNOSIS — I5022 Chronic systolic (congestive) heart failure: Secondary | ICD-10-CM

## 2022-06-25 ENCOUNTER — Other Ambulatory Visit: Payer: Self-pay | Admitting: Internal Medicine

## 2022-06-25 DIAGNOSIS — I5022 Chronic systolic (congestive) heart failure: Secondary | ICD-10-CM

## 2023-01-16 ENCOUNTER — Telehealth (HOSPITAL_COMMUNITY): Payer: Self-pay | Admitting: Pharmacy Technician

## 2023-01-16 NOTE — Telephone Encounter (Signed)
Advanced Heart Failure Patient Advocate Encounter  Received conditionally approved letter for Farxiga assistance from [employer]. Patient needs to complete her portion. She is suppose to get a link from [employer] to confirm who she is and update any information. If she is approved or denied after confirming her information, we will get an update. Called and spoke with the patient. She will call back with questions.  Archer Asa, CPhT

## 2023-01-31 ENCOUNTER — Other Ambulatory Visit (HOSPITAL_COMMUNITY): Payer: Self-pay

## 2023-01-31 MED ORDER — LOSARTAN POTASSIUM 50 MG PO TABS: 50.0000 mg | ORAL_TABLET | Freq: Every day | ORAL | 0 refills | Status: DC

## 2023-02-13 NOTE — Telephone Encounter (Signed)
Advanced Heart Failure Patient Advocate Encounter    Patient was automatically approved to receive Farxiga from [employer] through 03/13/24   Document scanned to chart. Patient will be notified via mail. Patient currently using the assistance through previous enrollment end date. Sent 90 day RX request to Chantel (CMA) to send to [employer]. No further action needed at this time.   Archer Asa, CPhT

## 2023-02-14 MED ORDER — DAPAGLIFLOZIN PROPANEDIOL 10 MG PO TABS: 10.0000 mg | ORAL_TABLET | Freq: Every day | ORAL | 3 refills | Status: DC

## 2023-02-14 NOTE — Telephone Encounter (Signed)
 SCRIPT SENT

## 2023-02-14 NOTE — Addendum Note (Signed)
Addended by: Theresia Bough on: 02/14/2023 11:38 AM   Modules accepted: Orders

## 2023-02-20 ENCOUNTER — Other Ambulatory Visit (HOSPITAL_COMMUNITY): Payer: Self-pay

## 2023-03-06 ENCOUNTER — Other Ambulatory Visit (HOSPITAL_COMMUNITY): Payer: Self-pay

## 2023-03-06 DIAGNOSIS — I5022 Chronic systolic (congestive) heart failure: Secondary | ICD-10-CM

## 2023-03-06 MED ORDER — LOSARTAN POTASSIUM 50 MG PO TABS: 50.0000 mg | ORAL_TABLET | Freq: Every day | ORAL | 0 refills | Status: DC

## 2023-03-28 ENCOUNTER — Other Ambulatory Visit (HOSPITAL_COMMUNITY): Payer: Self-pay

## 2023-03-28 DIAGNOSIS — I5022 Chronic systolic (congestive) heart failure: Secondary | ICD-10-CM

## 2023-03-30 ENCOUNTER — Telehealth (HOSPITAL_COMMUNITY): Payer: Self-pay | Admitting: Pharmacy Technician

## 2023-03-30 ENCOUNTER — Encounter (HOSPITAL_COMMUNITY): Payer: Medicare Other

## 2023-03-30 ENCOUNTER — Encounter (HOSPITAL_COMMUNITY): Payer: Self-pay | Admitting: Cardiology

## 2023-03-30 ENCOUNTER — Other Ambulatory Visit (HOSPITAL_COMMUNITY): Payer: Self-pay

## 2023-03-30 ENCOUNTER — Ambulatory Visit (HOSPITAL_COMMUNITY)
Admission: RE | Admit: 2023-03-30 | Discharge: 2023-03-30 | Disposition: A | Discharge status: Home / Self Care | Payer: Medicare Other | Source: Ambulatory Visit | Attending: Cardiology | Admitting: Cardiology

## 2023-03-30 VITALS — BP 124/80 | HR 86 | Wt 186.6 lb

## 2023-03-30 DIAGNOSIS — Z87891 Personal history of nicotine dependence: Secondary | ICD-10-CM | POA: Insufficient documentation

## 2023-03-30 DIAGNOSIS — Z79899 Other long term (current) drug therapy: Secondary | ICD-10-CM | POA: Diagnosis not present

## 2023-03-30 DIAGNOSIS — Z9889 Other specified postprocedural states: Secondary | ICD-10-CM | POA: Diagnosis not present

## 2023-03-30 DIAGNOSIS — Z8673 Personal history of transient ischemic attack (TIA), and cerebral infarction without residual deficits: Secondary | ICD-10-CM | POA: Insufficient documentation

## 2023-03-30 DIAGNOSIS — I110 Hypertensive heart disease with heart failure: Secondary | ICD-10-CM | POA: Diagnosis present

## 2023-03-30 DIAGNOSIS — I5022 Chronic systolic (congestive) heart failure: Secondary | ICD-10-CM | POA: Diagnosis not present

## 2023-03-30 DIAGNOSIS — Z9884 Bariatric surgery status: Secondary | ICD-10-CM | POA: Diagnosis not present

## 2023-03-30 DIAGNOSIS — I3139 Other pericardial effusion (noninflammatory): Secondary | ICD-10-CM | POA: Diagnosis not present

## 2023-03-30 DIAGNOSIS — I428 Other cardiomyopathies: Secondary | ICD-10-CM | POA: Diagnosis not present

## 2023-03-30 MED ORDER — ENTRESTO 24-26 MG PO TABS: 1.0000 | ORAL_TABLET | Freq: Two times a day (BID) | ORAL | 11 refills | Status: DC

## 2023-03-30 NOTE — Telephone Encounter (Signed)
Advanced Heart Failure Patient Advocate Encounter  The patient was approved for a [employer] grant that will help cover the cost of Entresto, Coreg and Spironolactone (Patient can use grant for [place], but she currently has PAP through 03/13/24). Total amount awarded, $10,000. Eligibility, 02/28/23 - 02/27/24.  ID [id number]  BIN [id number]  PCN [id number]  Group [id number]  Patient given copy while in office.  Archer Asa, CPhT

## 2023-03-30 NOTE — Progress Notes (Signed)
ADVANCED HEART FAILURE FOLLOW UP CLINIC NOTE  Referring Physician: [facility]  Primary Care: [facility] Primary Cardiologist:  HPI: Gina Nichols is a 53 y.o. female with with a history of craniotomy with resection of brain tumor 20 years ago, gastric sleeve 2020, IDA, SVT ablation 2015, HTN, tobacco abuse, depression, and systolic heart failure/NICM who presents for follow up of chronic systolic heart failure.      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 CTA head/neck with left MCA territory infarct. Underwent mechanical thrombectomy. Echo with newly reduced EF 20-25%. Cardiology consulted. TEE showed EF < 20% RV normal, no thrombus, and no PFO.  Underwent R/LHC showing normal coronaries. PA Sat 62% , RA 12, PA 53/32 (41), PCWP 33, CO 4.6, and CI 2.4. cMRI showed LVEF 16%, RVEF 39%, no obvious thrombus, moderate functional MR, moderate pericardial effusion (no tamponade), no ASD/PFO, no LGE      SUBJECTIVE:  Patient was hospitalized recently at [facility] for chest pain. Found to have acute elevated troponin consistent with ACS but coronary angiography was negative. She has not had further chest pain since. She denies any symptoms of volume overload, orthopnea, DOE. She was having severe nerve pain in her neck leading up to the episode but no recent cold/viral infections or other traumatic events.   PMH, current medications, allergies, social history, and family history reviewed in epic.  PHYSICAL EXAM: Vitals:   03/30/23 0929  BP: 124/80  Pulse: 86  SpO2: 98%   GENERAL: Well nourished and in no apparent distress at rest.  HEENT: The mucous membranes are pink and moist.   PULM:  Normal work of breathing, clear to auscultation bilaterally. Respirations are unlabored.  CARDIAC:  JVP: Not elevated         Normal rate with regular rhythm. No murmurs, rubs or gallops.  No edema.  ABDOMEN: Soft, non-tender,  non-distended. NEUROLOGIC: Patient is oriented x3 with no focal or lateralizing neurologic deficits.  PSYCH: Patients affect is appropriate, there is no evidence of anxiety or depression.  SKIN: Warm and dry; no lesions or wounds. Warm and well perfused extremities.  DATA REVIEW  ECG: 03/2023: NSR, LVH    ECHO: Echo (6/23): EF 20-25%   CATH: 08/2021: R/LHC showing normal coronaries. PA Sat 62% , RA 12, PA 53/32 (41), PCWP 33, CO 4.6, and CI 2.4.  cMRI (6/23): LVEF 16%, RVEF 39%, no obvious thrombus, moderate functional MR, moderate pericardial effusion (no tamponade), no ASD/PFO, no LGE    ASSESSMENT & PLAN:  Chronic Systolic Heart Failure - NICM - Echo (6/23):EF 20-25%, RV stable.  - R/LHC (6/23): with normal cors, elevated filling pressures, and preserved cardiac output.  - TEE (6/23): EF 20%, no PFO/thrombus but with smoke in LA.  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 Volume status ok. Continue Lasix 40 PRN. - Continue Coreg 3.125 mg bid. - Transition losartan to entresto 24/26mg  BID - Continue Farxiga 10 mg daily.  - Continue spironolactone 12.5 mg daily.  - Follow up with Dr. 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 - TEE- (6/23): EF 20% No PFO/thrombus but with smoke in [place] - suspect cardio-embolic -> now on Eliquis 5 mg bid. - No bleeding issues.   H/o SVT with ablation - Had ablation 2015 at [facility]    Tobacco Abuse - Stopped smoking cigars. -  Congratulated    Clearnce Hasten, MD Advanced Heart Failure Mechanical Circulatory Support 03/30/23

## 2023-03-30 NOTE — Patient Instructions (Addendum)
Stop Losartan  START Entresto 24/26 mg Twice daily  Your physician has requested that you have an echocardiogram. Echocardiography is a painless test that uses sound waves to create images of your heart. It provides your doctor with information about the size and shape of your heart and how well your heart's chambers and valves are working. This procedure takes approximately one hour. There are no restrictions for this procedure. Please do NOT wear cologne, perfume, aftershave, or lotions (deodorant is allowed). Please arrive 15 minutes prior to your appointment time.  Please note: We ask at that you not bring children with you during ultrasound (echo/ vascular) testing. Due to room size and safety concerns, children are not allowed in the ultrasound rooms during exams. Our front office staff cannot provide observation of children in our lobby area while testing is being conducted. An adult accompanying a patient to their appointment will only be allowed in the ultrasound room at the discretion of the ultrasound technician under special circumstances. We apologize for any inconvenience.  Your physician recommends that you schedule a follow-up appointment in: 3 months with an echocardiogram (April) ** PLEASE CALL THE OFFICE IN MID FEBRUARY TO ARRANGE YOUR FOLLOW UP APPOINTMENT.**  If you have any questions or concerns before your next appointment please send [place] a message through [place] or call our office at [phone].    TO LEAVE A MESSAGE FOR THE NURSE SELECT OPTION 2, PLEASE LEAVE A MESSAGE INCLUDING: YOUR NAME DATE OF BIRTH CALL BACK NUMBER REASON FOR CALL**this is important as we prioritize the call backs  YOU WILL RECEIVE A CALL BACK THE SAME DAY AS LONG AS YOU CALL BEFORE 4:00 PM  At the [facility], you and your health needs are our priority. As part of our continuing mission to provide you with exceptional heart care, we have created designated Provider Care Teams.  These Care Teams include your primary Cardiologist (physician) and Advanced Practice Providers (APPs- Physician Assistants and Nurse Practitioners) who all work together to provide you with the care you need, when you need it.   You may see any of the following providers on your designated Care Team at your next follow up: Dr Arvilla Meres Dr Marca Ancona Dr. Dorthula Nettles Dr. Clearnce Hasten Amy Filbert Schilder, NP Robbie Lis, [place] [facility], [place] Brynda Peon, NP [place] Lee, NP Karle Plumber, PharmD   Please be sure to bring in all your medications bottles to every appointment.    Thank you for choosing [facility]

## 2023-04-17 ENCOUNTER — Other Ambulatory Visit (HOSPITAL_COMMUNITY): Payer: Medicare Other

## 2023-04-24 ENCOUNTER — Telehealth (HOSPITAL_COMMUNITY): Payer: Self-pay | Admitting: Vascular Surgery

## 2023-04-24 NOTE — Telephone Encounter (Signed)
 Returned pt call to resch missed lab appt

## 2023-04-25 ENCOUNTER — Ambulatory Visit (HOSPITAL_COMMUNITY)
Admission: RE | Admit: 2023-04-25 | Discharge: 2023-04-25 | Disposition: A | Discharge status: Home / Self Care | Payer: Medicare Other | Source: Ambulatory Visit | Attending: Cardiology | Admitting: Cardiology

## 2023-04-25 DIAGNOSIS — I5022 Chronic systolic (congestive) heart failure: Secondary | ICD-10-CM | POA: Diagnosis present

## 2023-04-25 LAB — BASIC METABOLIC PANEL
Anion gap: 7 (ref 5–15)
BUN: 10 mg/dL (ref 6–20)
CO2: 25 mmol/L (ref 22–32)
Calcium: 8.7 mg/dL — ABNORMAL LOW (ref 8.9–10.3)
Chloride: 106 mmol/L (ref 98–111)
Creatinine, Ser: 0.85 mg/dL (ref 0.44–1.00)
GFR, Estimated: >60 mL/min (ref >60)
Glucose, Bld: 84 mg/dL (ref 70–99)
Potassium: 4.2 mmol/L (ref 3.5–5.1)
Sodium: 138 mmol/L (ref 135–145)

## 2023-04-27 ENCOUNTER — Encounter (HOSPITAL_COMMUNITY): Payer: Self-pay | Admitting: Cardiology

## 2023-07-12 ENCOUNTER — Encounter (HOSPITAL_COMMUNITY): Payer: Self-pay | Admitting: Cardiology

## 2023-07-12 ENCOUNTER — Ambulatory Visit (HOSPITAL_COMMUNITY)
Admission: RE | Admit: 2023-07-12 | Discharge: 2023-07-12 | Disposition: A | Discharge status: Home / Self Care | Source: Ambulatory Visit | Attending: Cardiology | Admitting: Cardiology

## 2023-07-12 ENCOUNTER — Other Ambulatory Visit: Payer: Self-pay

## 2023-07-12 VITALS — BP 122/80 | HR 62 | Wt 187.4 lb

## 2023-07-12 DIAGNOSIS — I63412 Cerebral infarction due to embolism of left middle cerebral artery: Secondary | ICD-10-CM | POA: Diagnosis not present

## 2023-07-12 DIAGNOSIS — I5022 Chronic systolic (congestive) heart failure: Secondary | ICD-10-CM | POA: Insufficient documentation

## 2023-07-12 DIAGNOSIS — I071 Rheumatic tricuspid insufficiency: Secondary | ICD-10-CM | POA: Insufficient documentation

## 2023-07-12 DIAGNOSIS — Z8679 Personal history of other diseases of the circulatory system: Secondary | ICD-10-CM | POA: Diagnosis not present

## 2023-07-12 DIAGNOSIS — Z006 Encounter for examination for normal comparison and control in clinical research program: Secondary | ICD-10-CM

## 2023-07-12 LAB — ECHOCARDIOGRAM COMPLETE
Area-P 1/2: 3.91 cm2
Calc EF: 51.1 %
S' Lateral: 3.5 cm
Single Plane A2C EF: 44.2 %
Single Plane A4C EF: 56.3 %

## 2023-07-12 MED ORDER — PERFLUTREN LIPID MICROSPHERE
1.0000 mL | INTRAVENOUS | Status: DC | PRN
Start: 2023-07-12 — End: 2023-07-12
  Administered 2023-07-12: 2 mL via INTRAVENOUS

## 2023-07-12 NOTE — Research (Signed)
 SITE: [id number]     Subject # [id number]  Subprotocol: A  Inclusion Criteria  Patients who meet all of the following criteria are eligible for enrollment as study participants:  Yes No  Age > 54 years old X   Eligible to wear Holter Study X    Exclusion Criteria  Patients who meet any of these criteria are not eligible for enrollment as study participants: Yes No  1. Receiving any mechanical (respiratory or circulatory) or renal support therapy at Screening or during Visit #1.  X  2.  Any other conditions that in the opinion of the investigators are likely to prevent compliance with the study protocol or pose a safety concern if the subject participates in the study.  X  3. Poor tolerance, namely susceptible to severe skin allergies from ECG adhesive patch application.  X   Protocol: REV H    60 minute start window         Cor device must be applied, and the study initiated, no later than 60 minutes of completing the Echocardiogram                             HH:MM  Echo completion time  13:54  2.   Cor Study start time  14:20   30-Minute execution window  Once Cor Monitoring begins, 3 QT Med ECGs and the 15-minute rest period must be completed within a 30 minute window     HH:MM  3. QT Med ECG Completion time  14:27  4. Start of 15-Min sitting rest period  14:28  5. End of 15-Min rest period  14:49  6. Time of device removal  15:51   *Continue to use the Mobile App Event feature to log the Rest period windows and follow instructions on the EF-ACT Clinical Trial  Patient Instruction Card.  Describe any anomalies in Protocol execution in the Protocol Deviation Log    Residential Zip code [id number] (First 3 digits ONLY)                                           [facility] Informed Consent   Subject Name: Gina Nichols  Subject met inclusion and exclusion criteria.  The informed consent form, study requirements and expectations were reviewed with the subject. Subject had opportunity  to read consent and questions and concerns were addressed prior to the signing of the consent form.  The subject verbalized understanding of the trial requirements.  The subject agreed to participate in the [facility] EF ACT trial and signed the informed consent at 14:11 on 12-Jul-2023.  The informed consent was obtained prior to performance of any protocol-specific procedures for the subject.  A copy of the signed informed consent was given to the subject and a copy was placed in the subject's medical record.   Jolee Naval          Current Outpatient Medications:    apixaban  (ELIQUIS ) 5 MG TABS tablet, Take 1 tablet (5 mg total) by mouth 2 (two) times daily., Disp: 60 tablet, Rfl: 1   atorvastatin  (LIPITOR) 40 MG tablet, TAKE 1 TABLET BY MOUTH EVERY DAY, Disp: 90 tablet, Rfl: 3   carvedilol  (COREG ) 3.125 MG tablet, TAKE 1 TABLET BY MOUTH TWICE A DAY WITH A MEAL, Disp: 180 tablet, Rfl: 1   cyclobenzaprine (FLEXERIL) 10  MG tablet, Take 10 mg by mouth as needed for muscle spasms., Disp: , Rfl:    dapagliflozin  propanediol (FARXIGA ) 10 MG TABS tablet, Take 1 tablet (10 mg total) by mouth daily., Disp: 90 tablet, Rfl: 3   esomeprazole (NEXIUM) 40 MG capsule, Take 40 mg by mouth daily at 12 noon., Disp: , Rfl:    furosemide  (LASIX ) 20 MG tablet, TAKE 1 TABLET BY MOUTH DAILY AS NEEDED. GREATER THAN 3 LBS IN 24 HOURS OR 5 LBS IN A WEEK, Disp: 90 tablet, Rfl: 0   Multiple Vitamins-Minerals (EMERGEN-C IMMUNE PO), Take 1 packet by mouth daily., Disp: , Rfl:    nitroGLYCERIN  (NITROSTAT ) 0.4 MG SL tablet, Place 1 tablet (0.4 mg total) under the tongue every 5 (five) minutes as needed for chest pain., Disp: 25 tablet, Rfl: 12   OIL OF [place]AN[place] PO, Take 2 capsules by mouth daily., Disp: , Rfl:    oxyCODONE  HCl 10 MG TABA, Take 10 mg by mouth as needed., Disp: , Rfl:    potassium chloride  SA (KLOR-CON  M) 20 MEQ tablet, TAKE 1 TABLET BY MOUTH DAILY AS NEEDED. TAKE WITH AS NEEDED LASIX , Disp: 90 tablet, Rfl: 0    sacubitril-valsartan (ENTRESTO ) 24-26 MG, Take 1 tablet by mouth 2 (two) times daily., Disp: 60 tablet, Rfl: 11   spironolactone  (ALDACTONE ) 25 MG tablet, Take 1 tablet (25 mg total) by mouth daily. NEEDS FOLLOW UP APPOINTMENT FOR ANYMORE REFILLS, Disp: 90 tablet, Rfl: 0   tiZANidine  (ZANAFLEX ) 4 MG capsule, Take 4 mg by mouth as needed for muscle spasms., Disp: , Rfl:  No current facility-administered medications for this visit.  Facility-Administered Medications Ordered in Other Visits:    perflutren lipid microspheres (DEFINITY) IV suspension, 1-10 mL, Intravenous, PRN, Lauralee Poll, MD, 2 mL at 07/12/23 1354

## 2023-07-12 NOTE — Progress Notes (Signed)
   ADVANCED HEART FAILURE FOLLOW UP CLINIC NOTE  Referring Physician: [facility]  Primary Care: [facility], [place], [place] Primary Cardiologist:  HPI: Gina Nichols is a 53 y.o. female who presents for follow up of chronic heart failure with improved EF.      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 CTA head/neck with left MCA territory infarct. Underwent mechanical thrombectomy. Echo with newly reduced EF 20-25%. Cardiology consulted. TEE showed EF < 20% RV normal, no thrombus, and no PFO.  Underwent R/LHC showing normal coronaries. PA Sat 62% , RA 12, PA 53/32 (41), PCWP 33, CO 4.6, and CI 2.4. cMRI showed LVEF 16%, RVEF 39%, no obvious thrombus, moderate functional MR, moderate pericardial effusion (no tamponade), no ASD/PFO, no LGE.   She was discharged and had overall been doing well. Was admitted in 01/2023 for acute onset of chest pain with cardiac biomarker elevation. Coronary angiography was normal, wall motion consistent with takotsubo potentially.      SUBJECTIVE: Patient reports that overall she is doing well. She denies any further chest pain or shortness of breath. She is stable on her current regimen and denies any issues with her meidcations. She does not regularly have to take the lasix .   PMH, current medications, allergies, social history, and family history reviewed in epic.  PHYSICAL EXAM: Vitals:   07/12/23 1432  BP: 122/80  Pulse: 62  SpO2: 98%   GENERAL: Well nourished and in no apparent distress at rest.  PULM:  Normal work of breathing, clear to auscultation bilaterally. Respirations are unlabored.  CARDIAC:  JVP: flat         Normal rate with regular rhythm. No murmurs, rubs or gallops.  No edema. Warm and well perfused extremities. ABDOMEN: Soft, non-tender, non-distended. NEUROLOGIC: Patient is oriented x3 with no focal or lateralizing neurologic deficits.    DATA REVIEW  ECG: 03/2023: NSR, LVH      ECHO: Echo (6/23): EF 20-25%  07/12/23: LVEF 50-55%, distal anterior and apical WMA   CATH: 08/2021: R/LHC showing normal coronaries. PA Sat 62% , RA 12, PA 53/32 (41), PCWP 33, CO 4.6, and CI 2.4.  02/03/2023: Femoral access, coronary arteries reported as normal.    cMRI (6/23): LVEF 16%, RVEF 39%, no obvious thrombus, moderate functional MR, moderate pericardial effusion (no tamponade), no ASD/PFO, no LGE   Heart failure review: - Classification: Heart failure with improved EF - Etiology: Idiopathic - NYHA Class: I - Volume status: Euvolemic  ASSESSMENT & PLAN:  Chronic Heart Failure with improved EF - NICM - EF previously 25-30, now 50-55%, multiple coronary angiographies as above - NYHA I-II. Volume status ok. Continue Lasix  40 PRN. - Continue Coreg  3.125 mg BID - Continue entresot 24/26mg  BID - Continue Farxiga  10 mg daily.  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 - TEE- (6/23): EF 20% No PFO/thrombus but with smoke in [place] - suspect cardio-embolic -> continues on Eliquis  5 mg bid. - No bleeding issues. - Likely lifelong OAC   H/o SVT with ablation - Had ablation 2015 at [facility]    Tobacco Abuse - Has quit   Follow up in 6 months  Arta Lark, MD Advanced [facility] Mechanical Circulatory Support 07/12/23

## 2023-07-12 NOTE — Patient Instructions (Signed)
 Medication Changes:  NO CHANGES  Lab Work:  NONE   Follow-Up in: 6 MONTHS WITH DR. Alease Amend. GIVE OUR OFFICE A CALL IN AUGUST TO SCHEDULE FOLLOW UP.    At the [facility], you and your health needs are our priority. We have a designated team specialized in the treatment of Heart Failure. This Care Team includes your primary Heart Failure Specialized Cardiologist (physician), Advanced Practice Providers (APPs- Physician Assistants and Nurse Practitioners), and Pharmacist who all work together to provide you with the care you need, when you need it.   You may see any of the following providers on your designated Care Team at your next follow up:  Dr. Jules Oar Dr. Peder Bourdon Dr. Alwin Baars Dr. Judyth Nunnery Nieves Bars, NP Ruddy Corral, [place] [facility] [place], [place] Dennise Fitz, NP [place] Lee, NP Luster Salters, PharmD   Please be sure to bring in all your medications bottles to every appointment.   Need to Contact Us :  If you have any questions or concerns before your next appointment please send us  a message through [place] or call our office at [phone].    TO LEAVE A MESSAGE FOR THE NURSE SELECT OPTION 2, PLEASE LEAVE A MESSAGE INCLUDING: YOUR NAME DATE OF BIRTH CALL BACK NUMBER REASON FOR CALL**this is important as we prioritize the call backs  YOU WILL RECEIVE A CALL BACK THE SAME DAY AS LONG AS YOU CALL BEFORE 4:00 PM

## 2023-11-23 ENCOUNTER — Other Ambulatory Visit (HOSPITAL_COMMUNITY): Payer: Self-pay | Admitting: Cardiology

## 2023-11-23 MED ORDER — DAPAGLIFLOZIN PROPANEDIOL 10 MG PO TABS: 10.0000 mg | ORAL_TABLET | Freq: Every day | ORAL | 3 refills | Status: AC

## 2023-12-20 ENCOUNTER — Telehealth (HOSPITAL_COMMUNITY): Payer: Self-pay

## 2023-12-20 NOTE — Telephone Encounter (Signed)
 Advanced Heart Failure Patient Advocate Encounter  Received notification from [employer] that patient has been conditionally approved for 2026 but needs to submit updated authorization form. Contacted patient by phone, patient consents to advocate signature for authorization.  Re-enrollment form faxed on 12/20/2023.  Rachel DEL, CPhT Rx Patient Advocate Phone: [phone]

## 2023-12-22 NOTE — Telephone Encounter (Signed)
 Patient was approved to receive Farxiga  from [employer] Effective 03/15/2023 to 03/13/2025

## 2024-04-06 ENCOUNTER — Other Ambulatory Visit (HOSPITAL_COMMUNITY): Payer: Self-pay | Admitting: Cardiology
